# Patient Record
Sex: Female | Born: 1985 | Race: White | Hispanic: No | Marital: Single | Smoking: Never smoker
Health system: Southern US, Community
[De-identification: ages and names within clinical notes are randomized; demographics above are authoritative.]

## PROBLEM LIST (undated history)

## (undated) DIAGNOSIS — M7660 Achilles tendinitis, unspecified leg: Secondary | ICD-10-CM

## (undated) DIAGNOSIS — F32A Depression, unspecified: Secondary | ICD-10-CM

## (undated) DIAGNOSIS — I82409 Acute embolism and thrombosis of unspecified deep veins of unspecified lower extremity: Secondary | ICD-10-CM

## (undated) DIAGNOSIS — Z8619 Personal history of other infectious and parasitic diseases: Secondary | ICD-10-CM

## (undated) DIAGNOSIS — F419 Anxiety disorder, unspecified: Secondary | ICD-10-CM

## (undated) DIAGNOSIS — T7840XA Allergy, unspecified, initial encounter: Secondary | ICD-10-CM

## (undated) HISTORY — DX: Personal history of other infectious and parasitic diseases: Z86.19

## (undated) HISTORY — PX: WISDOM TOOTH EXTRACTION: SHX21

## (undated) HISTORY — DX: Allergy, unspecified, initial encounter: T78.40XA

## (undated) HISTORY — DX: Anxiety disorder, unspecified: F41.9

## (undated) HISTORY — DX: Depression, unspecified: F32.A

---

## 2011-01-03 ENCOUNTER — Ambulatory Visit (INDEPENDENT_AMBULATORY_CARE_PROVIDER_SITE_OTHER): Payer: BC Managed Care – PPO

## 2011-01-03 DIAGNOSIS — M25559 Pain in unspecified hip: Secondary | ICD-10-CM

## 2011-07-20 ENCOUNTER — Ambulatory Visit (INDEPENDENT_AMBULATORY_CARE_PROVIDER_SITE_OTHER): Payer: BC Managed Care – PPO | Admitting: Physician Assistant

## 2011-07-20 VITALS — BP 136/82 | HR 79 | Temp 97.9°F | Resp 18 | Ht 64.0 in | Wt 147.6 lb

## 2011-07-20 DIAGNOSIS — L0291 Cutaneous abscess, unspecified: Secondary | ICD-10-CM

## 2011-07-20 DIAGNOSIS — R21 Rash and other nonspecific skin eruption: Secondary | ICD-10-CM

## 2011-07-20 DIAGNOSIS — L0390 Cellulitis, unspecified: Secondary | ICD-10-CM

## 2011-07-20 LAB — POCT CBC
Granulocyte percent: 64.3 %G (ref 37–80)
HCT, POC: 41.8 % (ref 37.7–47.9)
Hemoglobin: 13.5 g/dL (ref 12.2–16.2)
Lymph, poc: 3.1 (ref 0.6–3.4)
MCH, POC: 29 pg (ref 27–31.2)
MCHC: 32.3 g/dL (ref 31.8–35.4)
MCV: 89.9 fL (ref 80–97)
MID (cbc): 0.6 (ref 0–0.9)
MPV: 9 fL (ref 0–99.8)
POC Granulocyte: 6.6 (ref 2–6.9)
POC LYMPH PERCENT: 30.2 %L (ref 10–50)
POC MID %: 5.5 %M (ref 0–12)
Platelet Count, POC: 281 10*3/uL (ref 142–424)
RBC: 4.65 M/uL (ref 4.04–5.48)
RDW, POC: 12.8 %
WBC: 10.3 10*3/uL — AB (ref 4.6–10.2)

## 2011-07-20 MED ORDER — DOXYCYCLINE HYCLATE 100 MG PO CAPS: 100.0000 mg | ORAL_CAPSULE | Freq: Two times a day (BID) | ORAL | Status: AC

## 2011-07-20 NOTE — Progress Notes (Signed)
  Subjective:    Patient ID: Laura Glass, female    DOB: 08-02-1985, 26 y.o.   MRN: [id number]  HPI Patient presents with a possible bug bite on her left arm. Says 2 days ago she noticed a small bump, and then yesterday there was an area of redness around the bite. Today, it is tender to touch, has more surrounding erythema, and is warm. Admits to some clear drainage yesterday. She did not see a bug bite her and did not pull a tick off of her arm. She does admit to being out in the yard 4 days ago. Denies fever, chills, nausea, vomiting, or abdominal pain.    Review of Systems  Constitutional: Negative for fever and chills.  Gastrointestinal: Negative for nausea, vomiting and abdominal pain.  Musculoskeletal: Negative for myalgias, joint swelling and arthralgias.  Skin: Positive for rash.       Objective:   Physical Exam  Constitutional: She is oriented to person, place, and time. She appears well-developed and well-nourished.  HENT:  Head: Normocephalic and atraumatic.  Right Ear: External ear normal.  Left Ear: External ear normal.  Eyes: Conjunctivae are normal.  Neck: Normal range of motion.  Cardiovascular: Normal rate and normal heart sounds.   Pulmonary/Chest: Effort normal and breath sounds normal.  Lymphadenopathy:    She has no cervical adenopathy.  Neurological: She is alert and oriented to person, place, and time.  Skin:     Psychiatric: Her behavior is normal. Judgment and thought content normal.          Assessment & Plan:   1. Rash and nonspecific skin eruption  Will treat with Doxycycline twice daily x 10 days. Await wound culture.  May take Zyrtec daily in the a.m and Benadryl at night POCT CBC  2. Cellulitis  Return in 48 hours if symptoms not improving, sooner if worse.  doxycycline (VIBRAMYCIN) 100 MG capsule, Wound culture

## 2011-07-23 LAB — WOUND CULTURE
Gram Stain: NONE SEEN
Gram Stain: NONE SEEN

## 2011-07-27 ENCOUNTER — Telehealth: Payer: Self-pay

## 2011-07-27 NOTE — Telephone Encounter (Signed)
Left message for patient to call back, I want to make sure she is taking it with food and at least one hour before bedtime, will ask PA for further after getting more details.

## 2011-07-27 NOTE — Telephone Encounter (Signed)
Pt prescribe doxyicicline and since she has been taking it, medication is giving her acid reflux feeling please contact pt [phone]

## 2011-07-28 NOTE — Telephone Encounter (Signed)
Pt reports that she took the Doxy for about 3 days and then stopped it bc of the acid reflux it was causing. She stated she was taking it w/food but had the reflux pretty much constantly while on it. She tried OTC acid med and it did not help. Pt reports that the Doxy did seem to help. Her wound is healing well but not completely resolved.

## 2011-07-28 NOTE — Telephone Encounter (Signed)
As long as her wound is improving and she has no other symptoms (fever, chills, nausea, vomiting), she can stay off of the medication. Follow up if worsening.

## 2011-07-28 NOTE — Telephone Encounter (Signed)
Gave pt instr's from [place]. Pt verbalized understanding and agreement.

## 2012-06-12 ENCOUNTER — Other Ambulatory Visit: Payer: Self-pay | Admitting: Obstetrics and Gynecology

## 2012-06-15 ENCOUNTER — Other Ambulatory Visit: Payer: Self-pay | Admitting: Obstetrics and Gynecology

## 2013-08-21 ENCOUNTER — Ambulatory Visit (INDEPENDENT_AMBULATORY_CARE_PROVIDER_SITE_OTHER): Payer: BC Managed Care – PPO | Admitting: Emergency Medicine

## 2013-08-21 VITALS — BP 118/60 | HR 77 | Temp 99.0°F | Resp 16 | Ht 64.0 in | Wt 158.8 lb

## 2013-08-21 DIAGNOSIS — S79919A Unspecified injury of unspecified hip, initial encounter: Secondary | ICD-10-CM

## 2013-08-21 DIAGNOSIS — S79929A Unspecified injury of unspecified thigh, initial encounter: Secondary | ICD-10-CM

## 2013-08-21 DIAGNOSIS — S76301A Unspecified injury of muscle, fascia and tendon of the posterior muscle group at thigh level, right thigh, initial encounter: Secondary | ICD-10-CM

## 2013-08-21 MED ORDER — NAPROXEN SODIUM 550 MG PO TABS
550.0000 mg | ORAL_TABLET | Freq: Two times a day (BID) | ORAL | Status: DC
Start: 2013-08-21 — End: 2013-11-25

## 2013-08-21 NOTE — Progress Notes (Signed)
[facility] [address], [place] [address] [phone]  Date:  08/21/2013   Name:  Laura RombergBrittany Glass   DOB:  [birth year]   MRN:  [id number]  PCP:  No PCP Per Patient    Chief Complaint: pulled hamstring   History of Present Illness:  Laura RombergBrittany Glass is a 28 y.o. very pleasant female patient who presents with the following:  Injured her right thigh running to first base 10 days ago.  Has pain in the posterior thigh proximally.  Pain in knee as well.  (right).  Some ecchymosis posterior thigh just above the knee.  No improvement with over the counter medications or other home remedies. Denies other complaint or health concern today.   There are no active problems to display for this patient.   History reviewed. No pertinent past medical history.  History reviewed. No pertinent past surgical history.  History  Substance Use Topics  . Smoking status: Never Smoker   . Smokeless tobacco: Not on file  . Alcohol Use: Not on file    History reviewed. No pertinent family history.  No Known Allergies  Medication list has been reviewed and updated.  Current Outpatient Prescriptions on File Prior to Visit  Medication Sig Dispense Refill  . norethindrone-ethinyl estradiol (JUNEL FE,GILDESS FE,LOESTRIN FE) 1-20 MG-MCG tablet Take 1 tablet by mouth daily.       No current facility-administered medications on file prior to visit.    Review of Systems:  As per HPI, otherwise negative.    Physical Examination: Filed Vitals:   08/21/13 1536  BP: 118/60  Pulse: 77  Temp: 99 F (37.2 C)  Resp: 16   Filed Vitals:   08/21/13 1536  Height: 5\' 4"  (1.626 m)  Weight: 158 lb 12.8 oz (72.031 kg)   Body mass index is 27.24 kg/(m^2). Ideal Body Weight: Weight in (lb) to have BMI = 25: 145.3   GEN: WDWN, NAD, Non-toxic, Alert & Oriented x 3 HEENT: Atraumatic, Normocephalic.  Ears and Nose: No external deformity. EXTR: No clubbing/cyanosis/edema NEURO:  Normal gait.  PSYCH: Normally interactive. Conversant. Not depressed or anxious appearing.  Calm demeanor.  RIGHT leg:  Knee no effusion.  Joint stable.  Full ROM.   Thigh:  Ecchymosis posterior just proximal to popliteal space  Assessment and Plan: Hamstring tear Anaprox   Signed,  Phillips OdorJeffery Kalliopi Coupland, MD

## 2013-08-21 NOTE — Patient Instructions (Signed)

## 2013-11-25 ENCOUNTER — Ambulatory Visit (INDEPENDENT_AMBULATORY_CARE_PROVIDER_SITE_OTHER): Payer: BC Managed Care – PPO | Admitting: Emergency Medicine

## 2013-11-25 VITALS — BP 110/68 | HR 75 | Temp 98.4°F | Resp 18 | Ht 64.0 in | Wt 151.0 lb

## 2013-11-25 DIAGNOSIS — Z0189 Encounter for other specified special examinations: Secondary | ICD-10-CM

## 2013-11-25 DIAGNOSIS — Z0000 Encounter for general adult medical examination without abnormal findings: Secondary | ICD-10-CM

## 2013-11-25 NOTE — Progress Notes (Signed)

## 2013-11-25 NOTE — Progress Notes (Signed)
[facility] [address], [place] [address] [phone]  Date:  11/25/2013   Name:  Leonidas RombergBrittany Frankowski   DOB:  07/31/1985   MRN:  [id number]  PCP:  No PCP Per Patient    Chief Complaint: Employment Physical   History of Present Illness:  Leonidas RombergBrittany Meuth is a 28 y.o. very pleasant female patient who presents with the following:  Wellness examination Up to date on PAP   There are no active problems to display for this patient.   Past Medical History  Diagnosis Date  . Allergy     History reviewed. No pertinent past surgical history.  History  Substance Use Topics  . Smoking status: Never Smoker   . Smokeless tobacco: Not on file  . Alcohol Use: Not on file    Family History  Problem Relation Age of Onset  . Diabetes Mother     No Known Allergies  Medication list has been reviewed and updated.  Current Outpatient Prescriptions on File Prior to Visit  Medication Sig Dispense Refill  . norethindrone-ethinyl estradiol (JUNEL FE,GILDESS FE,LOESTRIN FE) 1-20 MG-MCG tablet Take 1 tablet by mouth daily.     No current facility-administered medications on file prior to visit.    Review of Systems:  As per HPI, otherwise negative.    Physical Examination: Filed Vitals:   11/25/13 0850  BP: 110/68  Pulse: 75  Temp: 98.4 F (36.9 C)  Resp: 18   Filed Vitals:   11/25/13 0850  Height: 5\' 4"  (1.626 m)  Weight: 151 lb (68.493 kg)   Body mass index is 25.91 kg/(m^2). Ideal Body Weight: Weight in (lb) to have BMI = 25: 145.3  GEN: WDWN, NAD, Non-toxic, A & O x 3 HEENT: Atraumatic, Normocephalic. Neck supple. No masses, No LAD. Ears and Nose: No external deformity. CV: RRR, No M/G/R. No JVD. No thrill. No extra heart sounds. PULM: CTA B, no wheezes, crackles, rhonchi. No retractions. No resp. distress. No accessory muscle use. ABD: S, NT, ND, +BS. No rebound. No HSM. EXTR: No c/c/e NEURO Normal gait.  PSYCH: Normally interactive.  Conversant. Not depressed or anxious appearing.  Calm demeanor.    Assessment and Plan: Wellness examination Declines blood work TBST   Signed,  Phillips OdorJeffery Anderson, MD

## 2014-01-02 ENCOUNTER — Encounter (HOSPITAL_COMMUNITY): Payer: Self-pay | Admitting: Emergency Medicine

## 2014-01-02 ENCOUNTER — Emergency Department (HOSPITAL_COMMUNITY)
Admission: EM | Admit: 2014-01-02 | Discharge: 2014-01-02 | Disposition: A | Discharge status: Home / Self Care | Payer: BC Managed Care – PPO | Source: Home / Self Care | Attending: Emergency Medicine | Admitting: Emergency Medicine

## 2014-01-02 DIAGNOSIS — J018 Other acute sinusitis: Secondary | ICD-10-CM

## 2014-01-02 MED ORDER — IPRATROPIUM BROMIDE 0.06 % NA SOLN: 2.0000 | Freq: Four times a day (QID) | NASAL | Status: DC

## 2014-01-02 MED ORDER — AMOXICILLIN-POT CLAVULANATE 875-125 MG PO TABS: 1.0000 | ORAL_TABLET | Freq: Two times a day (BID) | ORAL | Status: DC

## 2014-01-02 NOTE — ED Provider Notes (Signed)
CSN: [id number]     Arrival date & time 01/02/14  1550 History   First MD Initiated Contact with Patient 01/02/14 1619     Chief Complaint  Patient presents with  . Sore Throat   (Consider location/radiation/quality/duration/timing/severity/associated sxs/prior Treatment) HPI She is a 28 year old woman here for evaluation of nasal congestion. She states she has had upper respiratory symptoms for about a week. She was starting to get better on Monday, but then had worsening of her nasal congestion. She denies any fevers. She does have a mild sore throat and a mild cough. She does report some mild ear pain as well. No nausea or vomiting.  Past Medical History  Diagnosis Date  . Allergy    History reviewed. No pertinent past surgical history. Family History  Problem Relation Age of Onset  . Diabetes Mother    History  Substance Use Topics  . Smoking status: Never Smoker   . Smokeless tobacco: Not on file  . Alcohol Use: No   OB History    No data available     Review of Systems  Constitutional: Negative for fever.  HENT: Positive for congestion, ear pain, rhinorrhea, sinus pressure and sore throat. Negative for trouble swallowing.   Respiratory: Positive for cough. Negative for shortness of breath.   Gastrointestinal: Negative for nausea.  Neurological: Positive for headaches.    Allergies  Review of patient's allergies indicates no known allergies.  Home Medications   Prior to Admission medications   Medication Sig Start Date End Date Taking? Authorizing Provider  amoxicillin-clavulanate (AUGMENTIN) 875-125 MG per tablet Take 1 tablet by mouth 2 (two) times daily. 01/02/14   Charm RingsErin J Honig, MD  ipratropium (ATROVENT) 0.06 % nasal spray Place 2 sprays into both nostrils 4 (four) times daily. 01/02/14   Charm RingsErin J Honig, MD  norethindrone-ethinyl estradiol (JUNEL FE,GILDESS FE,LOESTRIN FE) 1-20 MG-MCG tablet Take 1 tablet by mouth daily.    Historical Provider, MD   BP 133/95  mmHg  Pulse 80  Temp(Src) 98.6 F (37 C) (Oral)  Resp 14  SpO2 100%  LMP 12/12/2013 (Approximate) Physical Exam  Constitutional: She is oriented to person, place, and time. She appears well-developed and well-nourished.  HENT:  Head: Normocephalic and atraumatic.  Right Ear: Tympanic membrane and external ear normal.  Left Ear: Tympanic membrane and external ear normal.  Nose: Mucosal edema and rhinorrhea present. Right sinus exhibits maxillary sinus tenderness. Right sinus exhibits no frontal sinus tenderness. Left sinus exhibits maxillary sinus tenderness. Left sinus exhibits no frontal sinus tenderness.  Mouth/Throat: Mucous membranes are normal. No oropharyngeal exudate or posterior oropharyngeal erythema.  Neck: Neck supple.  Cardiovascular: Normal rate, regular rhythm and normal heart sounds.   No murmur heard. Pulmonary/Chest: Effort normal and breath sounds normal. No respiratory distress. She has no wheezes. She has no rales.  Lymphadenopathy:    She has no cervical adenopathy.  Neurological: She is alert and oriented to person, place, and time.    ED Course  Procedures (including critical care time) Labs Review Labs Reviewed - No data to display  Imaging Review No results found.   MDM   1. Other acute sinusitis    Time course concerning for bacterial sinusitis. We'll treat with Augmentin for 10 days. Also Atrovent nasal spray to help with congestion. Follow-up as needed.    Charm RingsErin J Honig, MD 01/02/14 [phone]

## 2014-01-02 NOTE — Discharge Instructions (Signed)
You have a sinus infection. Take Augmentin 1 pill twice a day for 10 days. Use Atrovent nasal spray to help with the congestion.  Follow up if your symptoms do not improve in the next week or you develop fevers/severe facial pain.

## 2014-01-02 NOTE — ED Notes (Signed)
28 year old female here today with complaints of a sore throat, headache, and general URI symptoms for about 1 week.

## 2014-08-12 ENCOUNTER — Ambulatory Visit (INDEPENDENT_AMBULATORY_CARE_PROVIDER_SITE_OTHER): Payer: BLUE CROSS/BLUE SHIELD

## 2014-08-12 ENCOUNTER — Ambulatory Visit (INDEPENDENT_AMBULATORY_CARE_PROVIDER_SITE_OTHER): Payer: BLUE CROSS/BLUE SHIELD | Admitting: Internal Medicine

## 2014-08-12 VITALS — BP 126/70 | HR 66 | Temp 98.6°F | Resp 16 | Ht 65.0 in | Wt 147.6 lb

## 2014-08-12 DIAGNOSIS — M25561 Pain in right knee: Secondary | ICD-10-CM | POA: Diagnosis not present

## 2014-08-12 DIAGNOSIS — M79672 Pain in left foot: Secondary | ICD-10-CM

## 2014-08-12 MED ORDER — MELOXICAM 15 MG PO TABS: 15.0000 mg | ORAL_TABLET | Freq: Every day | ORAL | Status: DC

## 2014-08-12 NOTE — Progress Notes (Signed)
Subjective:  This chart was scribed for Ellamae Sia, MD by Stann Ore, Medical Scribe. This patient was seen in Room 12 and the patient's care was started at 12:18 PM.     Patient ID: Laura Glass, female    DOB: 1985/04/11, 29 y.o.   MRN: [id number]  HPI Laura Glass is a 29 y.o. female who presents to [facility] complaining of left heel pain that has started 2 years ago. She notes that the pain has stopped for a while, but recently, it started again and hurts all the time. She denies having x-ray done on the area. No specific injury. This is not under the heel but at the very backside. Hurts in the morning. Hurts with weightbearing. Hurts with flexion of the foot.  She also mentions pain behind her right knee. She feels the pain when she pulls her knee in. She is able to walk and run; however, she feels the pain. This pain will ache at night. She is currently wearing a knee compression sleeve and also takes Advil before she plays softball. She denies any major injuries. She also says she was here 2 years ago for similar problem and was given anaprox for temporary relief. The diagnosis was hamstring tear given visible ecchymoses in the popliteal area per Dr. Dareen Piano. However, She is able to run and play with this discomfort while wearing the compression sleeve and the pain does not seem to extend into the calf or up into the posterior thigh after use. She does occasionally have some swelling the next day which seems to be in the knee. She also has a give way sensation at times  Pt mentions she plays softball frequently and stays active at the gym.   There are no active problems to display for this patient.   Current outpatient prescriptions:  .  norethindrone-ethinyl estradiol (JUNEL FE,GILDESS FE,LOESTRIN FE) 1-20 MG-MCG tablet, Take 1 tablet by mouth daily., Disp: , Rfl:  .  amoxicillin-clavulanate (AUGMENTIN) 875-125 MG per tablet, Take 1 tablet by mouth 2 (two) times daily. (Patient  not taking: Reported on 08/12/2014), Disp: 20 tablet, Rfl: 0 .  ipratropium (ATROVENT) 0.06 % nasal spray, Place 2 sprays into both nostrils 4 (four) times daily. (Patient not taking: Reported on 08/12/2014), Disp: 15 mL, Rfl: 0    Review of Systems  Constitutional: Negative for fever, chills, diaphoresis and fatigue.  Gastrointestinal: Negative for nausea, vomiting, diarrhea and constipation.  Musculoskeletal: Positive for arthralgias (left heel, behind right knee).  Skin: Negative for rash and wound.  Neurological: Negative for dizziness, weakness and headaches.       Objective:   Physical Exam  Constitutional: She is oriented to person, place, and time. She appears well-developed and well-nourished. No distress.  HENT:  Head: Normocephalic and atraumatic.  Eyes: EOM are normal. Pupils are equal, round, and reactive to light.  Neck: Neck supple.  Cardiovascular: Normal rate.   Pulmonary/Chest: Effort normal. No respiratory distress.  Musculoskeletal: Normal range of motion.  The right knee is not swollen, and is not tender to palpation or range of motion, and shows no laxity to stress ors including a negative McMurray  The left heel is tender to palpation at the insertion of the Achilles more medially than laterally without effusion redness or  actual swelling of the Achilles  Neurological: She is alert and oriented to person, place, and time.  Skin: Skin is warm and dry.  Psychiatric: She has a normal mood and affect. Her behavior is normal.  Nursing note and vitals reviewed.   BP 126/70 mmHg  Pulse 66  Temp(Src) 98.6 F (37 C) (Oral)  Resp 16  Ht 5\' 5"  (1.651 m)  Wt 147 lb 9.6 oz (66.951 kg)  BMI 24.56 kg/m2  SpO2 98%  LMP 07/29/2014  [facility] reading (PRIMARY) by  Dr.Malene Blaydes= no fracture of the calcaneus       Assessment & Plan:  Pain, heel, left - Plan: DG [place] Left, Ambulatory referral to Sports Medicine --I'll put her on meloxicam with a heel left and have  her swim more than run but I think she would benefit from a sports medicine consult so an ultrasound can be performed and consideration for nitroglycerin made as this seems like a chronic inflammatory process at the insertion of Achilles  Knee pain, right - Plan: Ambulatory referral to Sports Medicine This history is suspicious for a meniscal injury and so I have recommended a second opinion  Meds ordered this encounter  Medications  . meloxicam (MOBIC) 15 MG tablet    Sig: Take 1 tablet (15 mg total) by mouth daily.    Dispense:  30 tablet    Refill:  0    I have completed the patient encounter in its entirety as documented by the scribe, with editing by me where necessary. Suhas Estis P. Merla Richesoolittle, M.D.

## 2014-08-26 ENCOUNTER — Ambulatory Visit (INDEPENDENT_AMBULATORY_CARE_PROVIDER_SITE_OTHER): Payer: BLUE CROSS/BLUE SHIELD | Admitting: Sports Medicine

## 2014-08-26 ENCOUNTER — Encounter: Payer: Self-pay | Admitting: Sports Medicine

## 2014-08-26 VITALS — BP 128/74 | Ht 65.0 in | Wt 145.0 lb

## 2014-08-26 DIAGNOSIS — M67879 Other specified disorders of synovium and tendon, unspecified ankle and foot: Secondary | ICD-10-CM | POA: Diagnosis not present

## 2014-08-26 DIAGNOSIS — M7660 Achilles tendinitis, unspecified leg: Secondary | ICD-10-CM

## 2014-08-26 MED ORDER — NITROGLYCERIN 0.2 MG/HR TD PT24: 0.2000 mg | MEDICATED_PATCH | Freq: Every day | TRANSDERMAL | Status: DC

## 2014-08-26 NOTE — Progress Notes (Signed)
   Subjective:    Patient ID: Laura Glass, female    DOB: October 03, 1985, 29 y.o.   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 She is complaining of pain in the posterior aspect of her right knee. She has had symptoms intermittently for 2-3 years. She describes an aching discomfort which was initially present only with playing softball but has become more noticeable now with activities of daily living. Her pain is most noticeable with going up and down stairs or with prolonged walking. She denies any instability. No known trauma. She's not noticed any swelling. No prior knee surgeries. Pain does not radiate into her calf or foot. No associated numbness or tingling. She has had similar issues in the past with her left knee as well. She is also complaining of 3 months of posterior left heel pain. Again, no known trauma. Pain is worse at the end of running. She localizes her pain to the insertion of the Achilles tendon onto the posterior calcaneus. She has not noticed any swelling. Recent x-rays were unremarkable. She denies any pain along the plantar aspect of her foot. She was prescribed meloxicam at a local urgent care which did seem to help some.    Review of Systems As above    Objective:   Physical Exam Well-developed, fit appearing. No acute distress. Awake alert and oriented 3.  Right knee: Full range of motion. No effusion. No soft tissue swelling. Negative anterior drawer, negative posterior drawer. No joint line tenderness. Negative McMurray's. Negative Thessaly's. She is tender to palpation in the popliteal fossa near the area of the popliteus muscle and the medial head of the gastrocnemius. No tenderness to palpation over the hamstrings. Neurovascularly intact distally.  Left heel: There is tenderness to palpation at the insertion of the Achilles tendon onto the calcaneus. No soft  tissue swelling. Negative calcaneal squeeze. No tenderness to palpation along the mid substance of the Achilles. Good strength. Neurovascularly intact distally. Walking without significant limp.  MSK ultrasound of the left Achilles tendon was performed. Limited images were obtained. Both long and short axis images were saved. There are hypoechoic changes as well as several areas of calcification in the distal Achilles tendon just proximal to the insertion of the calcaneus. These changes are seen both in long and short view. Remainder of the Achilles tendon is unremarkable. 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 I have referred her to Ellamae Sia for treatment of both her knee and her heel. In addition I think we should try a 5/16 inch heel lift and I will start her on a topical nitroglycerin protocol for her Achilles tendinopathy. She will use a quarter patch daily. Follow-up with me in 6 weeks. We will plan on repeating her ultrasound at that time. She may continue with activity as tolerated using pain as her guide. I've also encouraged her to utilize icing post exercise. Call with questions or concerns prior to her follow-up visit.

## 2014-08-26 NOTE — Patient Instructions (Signed)

## 2014-09-26 ENCOUNTER — Ambulatory Visit (INDEPENDENT_AMBULATORY_CARE_PROVIDER_SITE_OTHER): Payer: BLUE CROSS/BLUE SHIELD | Admitting: Family Medicine

## 2014-09-26 VITALS — BP 120/78 | HR 70 | Temp 97.3°F | Resp 16 | Ht 65.0 in | Wt 147.0 lb

## 2014-09-26 DIAGNOSIS — J069 Acute upper respiratory infection, unspecified: Secondary | ICD-10-CM | POA: Diagnosis not present

## 2014-09-26 NOTE — Progress Notes (Addendum)
Subjective:  This chart was scribed for Meredith Staggers, MD by Andrew Au, ED Scribe. This patient was seen in room 3 and the patient's care was started at 4:54 PM.  Patient ID: Laura Glass, female    DOB: 03/06/1985, 29 y.o.   MRN: [id number]  HPI   Chief Complaint  Patient presents with  . Sinusitis    Onset 2 days  . Headache  . Sore Throat   HPI Comments: Laura Glass is a 29 y.o. female who presents to the [facility] complaining of nasal congestion and HA that began 3 days ago. Pt states symptoms started with nasal congestion with clear drainage, sore throat, HA and upper bilateral teeth and face pain, more yesterday than today. She also had hot and cold spells but did not measure a fever. Pt has tried tylenol cold and sinus without relief to symptoms. 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 She denies children having similar symptom.     There are no active problems to display for this patient.  Past Medical History  Diagnosis Date  . Allergy    No past surgical history on file. No Known Allergies Prior to Admission medications   Medication Sig Start Date End Date Taking? Authorizing Provider  JUNEL FE 1.5/30 1.5-30 MG-MCG tablet TAKE 1 TABLET(S) EVERY DAY BY ORAL ROUTE. 07/04/14  Yes Historical Provider, MD  meloxicam (MOBIC) 15 MG tablet Take 1 tablet (15 mg total) by mouth daily. 08/12/14  Yes Tonye Pearson, MD  nitroGLYCERIN (NITRO-DUR) 0.2 mg/hr patch Place 1 patch (0.2 mg total) onto the skin daily. 08/26/14  Yes Ralene Cork, DO  norethindrone-ethinyl estradiol (JUNEL FE,GILDESS FE,LOESTRIN FE) 1-20 MG-MCG tablet Take 1 tablet by mouth daily.   Yes Historical Provider, MD  ipratropium (ATROVENT) 0.06 % nasal spray Place 2 sprays into both nostrils 4 (four) times daily. Patient not taking: Reported on 08/12/2014 01/02/14   Charm Rings, MD   Social History   Social History  . Marital Status: Single    Spouse Name: N/A    . Number of Children: N/A  . Years of Education: N/A   Occupational History  . Not on file.   Social History Main Topics  . Smoking status: Never Smoker   . Smokeless tobacco: Not on file  . Alcohol Use: No  . Drug Use: No  . Sexual Activity: Yes    Birth Control/ Protection: Pill   Other Topics Concern  . Not on file   Social History Narrative   Review of Systems  Constitutional: Positive for chills. Negative for fever.  HENT: Positive for congestion, dental problem ( pain), sinus pressure and sore throat.   Neurological: Positive for headaches.   Objective:   Physical Exam  Constitutional: She is oriented to person, place, and time. She appears well-developed and well-nourished. No distress.  HENT:  Head: Normocephalic and atraumatic.  Right Ear: External ear normal.  Left Ear: External ear normal.  Mouth/Throat: Uvula is midline, oropharynx is clear and moist and mucous membranes are normal. No oropharyngeal exudate.  Edematous turbinates bilaterally. No discharge.   Eyes: Conjunctivae and EOM are normal. Pupils are equal, round, and reactive to light.  Neck: Normal range of motion. Neck supple.  Cardiovascular: Normal rate, regular rhythm and normal heart sounds.  Exam reveals no gallop and no friction rub.   No murmur heard. Pulmonary/Chest: Effort normal and breath sounds normal. No respiratory distress. She has no wheezes. She has no  rales.  Musculoskeletal: Normal range of motion.  Neurological: She is alert and oriented to person, place, and time.  Skin: Skin is warm and dry.  Psychiatric: She has a normal mood and affect. Her behavior is normal.  Nursing note and vitals reviewed.  Filed Vitals:   09/26/14 1628  BP: 120/78  Pulse: 70  Temp: 97.3 F (36.3 C)  TempSrc: Oral  Resp: 16  Height: 5\' 5"  (1.651 m)  Weight: 147 lb (66.679 kg)  SpO2: 98%   Assessment & Plan:  Laura Glass is a 29 y.o. female Acute upper respiratory infection  -Suspected  viral illness, with some sinus pressure. Doubt bacterial sinusitis at this time. Symptomatically care discussed, but if increasing sinus exhibits diffuse next week including secondary sickening or persistent purulent nasal discharge, can call in Augmentin. RTC precautions discussed.   No orders of the defined types were placed in this encounter.   Patient Instructions  Saline nasal spray atleast 4 times per day, over the counter mucinex or mucinex DM if needed for cough, decongestant such as Tylenol Sinus or Sudafed if needed for pressure in the head or face. Drink plenty of fluids.   If your symptoms start to worsen again with the increasing face pain, or discolored nasal discharge that is persisting into next week, give me a call and I can call in Augmentin for you. If you do start to have fevers or worsening, I recommend recheck here or other medical provider.  Return to the clinic or go to the nearest emergency room if any of your symptoms worsen or new symptoms occur.  Upper Respiratory Infection, Adult An upper respiratory infection (URI) is also sometimes known as the common cold. The upper respiratory tract includes the nose, sinuses, throat, trachea, and bronchi. Bronchi are the airways leading to the lungs. Most people improve within 1 week, but symptoms can last up to 2 weeks. A residual cough may last even longer.  CAUSES Many different viruses can infect the tissues lining the upper respiratory tract. The tissues become irritated and inflamed and often become very moist. Mucus production is also common. A cold is contagious. You can easily spread the virus to others by oral contact. This includes kissing, sharing a glass, coughing, or sneezing. Touching your mouth or nose and then touching a surface, which is then touched by another person, can also spread the virus. SYMPTOMS  Symptoms typically develop 1 to 3 days after you come in contact with a cold virus. Symptoms vary from person to  person. They may include:  Runny nose.  Sneezing.  Nasal congestion.  Sinus irritation.  Sore throat.  Loss of voice (laryngitis).  Cough.  Fatigue.  Muscle aches.  Loss of appetite.  Headache.  Low-grade fever. DIAGNOSIS  You might diagnose your own cold based on familiar symptoms, since most people get a cold 2 to 3 times a year. Your caregiver can confirm this based on your exam. Most importantly, your caregiver can check that your symptoms are not due to another disease such as strep throat, sinusitis, pneumonia, asthma, or epiglottitis. Blood tests, throat tests, and X-rays are not necessary to diagnose a common cold, but they may sometimes be helpful in excluding other more serious diseases. Your caregiver will decide if any further tests are required. RISKS AND COMPLICATIONS  You may be at risk for a more severe case of the common cold if you smoke cigarettes, have chronic heart disease (such as heart failure) or lung disease (such  as asthma), or if you have a weakened immune system. The very young and very old are also at risk for more serious infections. Bacterial sinusitis, middle ear infections, and bacterial pneumonia can complicate the common cold. The common cold can worsen asthma and chronic obstructive pulmonary disease (COPD). Sometimes, these complications can require emergency medical care and may be life-threatening. PREVENTION  The best way to protect against getting a cold is to practice good hygiene. Avoid oral or hand contact with people with cold symptoms. Wash your hands often if contact occurs. There is no clear evidence that vitamin C, vitamin E, echinacea, or exercise reduces the chance of developing a cold. However, it is always recommended to get plenty of rest and practice good nutrition. TREATMENT  Treatment is directed at relieving symptoms. There is no cure. Antibiotics are not effective, because the infection is caused by a virus, not by bacteria.  Treatment may include:  Increased fluid intake. Sports drinks offer valuable electrolytes, sugars, and fluids.  Breathing heated mist or steam (vaporizer or shower).  Eating chicken soup or other clear broths, and maintaining good nutrition.  Getting plenty of rest.  Using gargles or lozenges for comfort.  Controlling fevers with ibuprofen or acetaminophen as directed by your caregiver.  Increasing usage of your inhaler if you have asthma. Zinc gel and zinc lozenges, taken in the first 24 hours of the common cold, can shorten the duration and lessen the severity of symptoms. Pain medicines may help with fever, muscle aches, and throat pain. A variety of non-prescription medicines are available to treat congestion and runny nose. Your caregiver can make recommendations and may suggest nasal or lung inhalers for other symptoms.  HOME CARE INSTRUCTIONS   Only take over-the-counter or prescription medicines for pain, discomfort, or fever as directed by your caregiver.  Use a warm mist humidifier or inhale steam from a shower to increase air moisture. This may keep secretions moist and make it easier to breathe.  Drink enough water and fluids to keep your urine clear or pale yellow.  Rest as needed.  Return to work when your temperature has returned to normal or as your caregiver advises. You may need to stay home longer to avoid infecting others. You can also use a face mask and careful hand washing to prevent spread of the virus. SEEK MEDICAL CARE IF:   After the first few days, you feel you are getting worse rather than better.  You need your caregiver's advice about medicines to control symptoms.  You develop chills, worsening shortness of breath, or brown or red sputum. These may be signs of pneumonia.  You develop yellow or brown nasal discharge or pain in the face, especially when you bend forward. These may be signs of sinusitis.  You develop a fever, swollen neck glands, pain  with swallowing, or white areas in the back of your throat. These may be signs of strep throat. SEEK IMMEDIATE MEDICAL CARE IF:   You have a fever.  You develop severe or persistent headache, ear pain, sinus pain, or chest pain.  You develop wheezing, a prolonged cough, cough up blood, or have a change in your usual mucus (if you have chronic lung disease).  You develop sore muscles or a stiff neck. Document Released: 07/06/2000 Document Revised: 04/04/2011 Document Reviewed: 04/17/2013 [facility] Patient Information 2015 [place], [place]. This information is not intended to replace advice given to you by your health care provider. Make sure you discuss any questions you have  with your health care provider.    I personally performed the services described in this documentation, which was scribed in my presence. The recorded information has been reviewed and considered, and addended by me as needed.

## 2014-09-26 NOTE — Patient Instructions (Signed)
Saline nasal spray atleast 4 times per day, over the counter mucinex or mucinex DM if needed for cough, decongestant such as Tylenol Sinus or Sudafed if needed for pressure in the head or face. Drink plenty of fluids.   If your symptoms start to worsen again with the increasing face pain, or discolored nasal discharge that is persisting into next week, give me a call and I can call in Augmentin for you. If you do start to have fevers or worsening, I recommend recheck here or other medical provider.  Return to the clinic or go to the nearest emergency room if any of your symptoms worsen or new symptoms occur.  Upper Respiratory Infection, Adult An upper respiratory infection (URI) is also sometimes known as the common cold. The upper respiratory tract includes the nose, sinuses, throat, trachea, and bronchi. Bronchi are the airways leading to the lungs. Most people improve within 1 week, but symptoms can last up to 2 weeks. A residual cough may last even longer.  CAUSES Many different viruses can infect the tissues lining the upper respiratory tract. The tissues become irritated and inflamed and often become very moist. Mucus production is also common. A cold is contagious. You can easily spread the virus to others by oral contact. This includes kissing, sharing a glass, coughing, or sneezing. Touching your mouth or nose and then touching a surface, which is then touched by another person, can also spread the virus. SYMPTOMS  Symptoms typically develop 1 to 3 days after you come in contact with a cold virus. Symptoms vary from person to person. They may include:  Runny nose.  Sneezing.  Nasal congestion.  Sinus irritation.  Sore throat.  Loss of voice (laryngitis).  Cough.  Fatigue.  Muscle aches.  Loss of appetite.  Headache.  Low-grade fever. DIAGNOSIS  You might diagnose your own cold based on familiar symptoms, since most people get a cold 2 to 3 times a year. Your caregiver  can confirm this based on your exam. Most importantly, your caregiver can check that your symptoms are not due to another disease such as strep throat, sinusitis, pneumonia, asthma, or epiglottitis. Blood tests, throat tests, and X-rays are not necessary to diagnose a common cold, but they may sometimes be helpful in excluding other more serious diseases. Your caregiver will decide if any further tests are required. RISKS AND COMPLICATIONS  You may be at risk for a more severe case of the common cold if you smoke cigarettes, have chronic heart disease (such as heart failure) or lung disease (such as asthma), or if you have a weakened immune system. The very young and very old are also at risk for more serious infections. Bacterial sinusitis, middle ear infections, and bacterial pneumonia can complicate the common cold. The common cold can worsen asthma and chronic obstructive pulmonary disease (COPD). Sometimes, these complications can require emergency medical care and may be life-threatening. PREVENTION  The best way to protect against getting a cold is to practice good hygiene. Avoid oral or hand contact with people with cold symptoms. Wash your hands often if contact occurs. There is no clear evidence that vitamin C, vitamin E, echinacea, or exercise reduces the chance of developing a cold. However, it is always recommended to get plenty of rest and practice good nutrition. TREATMENT  Treatment is directed at relieving symptoms. There is no cure. Antibiotics are not effective, because the infection is caused by a virus, not by bacteria. Treatment may include:  Increased fluid  intake. Sports drinks offer valuable electrolytes, sugars, and fluids.  Breathing heated mist or steam (vaporizer or shower).  Eating chicken soup or other clear broths, and maintaining good nutrition.  Getting plenty of rest.  Using gargles or lozenges for comfort.  Controlling fevers with ibuprofen or acetaminophen as  directed by your caregiver.  Increasing usage of your inhaler if you have asthma. Zinc gel and zinc lozenges, taken in the first 24 hours of the common cold, can shorten the duration and lessen the severity of symptoms. Pain medicines may help with fever, muscle aches, and throat pain. A variety of non-prescription medicines are available to treat congestion and runny nose. Your caregiver can make recommendations and may suggest nasal or lung inhalers for other symptoms.  HOME CARE INSTRUCTIONS   Only take over-the-counter or prescription medicines for pain, discomfort, or fever as directed by your caregiver.  Use a warm mist humidifier or inhale steam from a shower to increase air moisture. This may keep secretions moist and make it easier to breathe.  Drink enough water and fluids to keep your urine clear or pale yellow.  Rest as needed.  Return to work when your temperature has returned to normal or as your caregiver advises. You may need to stay home longer to avoid infecting others. You can also use a face mask and careful hand washing to prevent spread of the virus. SEEK MEDICAL CARE IF:   After the first few days, you feel you are getting worse rather than better.  You need your caregiver's advice about medicines to control symptoms.  You develop chills, worsening shortness of breath, or brown or red sputum. These may be signs of pneumonia.  You develop yellow or brown nasal discharge or pain in the face, especially when you bend forward. These may be signs of sinusitis.  You develop a fever, swollen neck glands, pain with swallowing, or white areas in the back of your throat. These may be signs of strep throat. SEEK IMMEDIATE MEDICAL CARE IF:   You have a fever.  You develop severe or persistent headache, ear pain, sinus pain, or chest pain.  You develop wheezing, a prolonged cough, cough up blood, or have a change in your usual mucus (if you have chronic lung disease).  You  develop sore muscles or a stiff neck. Document Released: 07/06/2000 Document Revised: 04/04/2011 Document Reviewed: 04/17/2013 [facility] Patient Information 2015 [place], [place]. This information is not intended to replace advice given to you by your health care provider. Make sure you discuss any questions you have with your health care provider.

## 2014-10-02 LAB — HM PAP SMEAR: HM Pap smear: NORMAL

## 2014-10-06 ENCOUNTER — Ambulatory Visit (INDEPENDENT_AMBULATORY_CARE_PROVIDER_SITE_OTHER): Payer: BLUE CROSS/BLUE SHIELD | Admitting: Sports Medicine

## 2014-10-06 ENCOUNTER — Encounter: Payer: Self-pay | Admitting: Sports Medicine

## 2014-10-06 VITALS — BP 115/71 | Ht 65.0 in | Wt 144.0 lb

## 2014-10-06 DIAGNOSIS — M67879 Other specified disorders of synovium and tendon, unspecified ankle and foot: Secondary | ICD-10-CM

## 2014-10-06 DIAGNOSIS — M7660 Achilles tendinitis, unspecified leg: Secondary | ICD-10-CM

## 2014-10-06 NOTE — Progress Notes (Signed)
   Subjective:    Patient ID: Laura Glass, female    DOB: [birth year], 29 y.o.   MRN: [id number]  HPI  Patient comes in today for follow-up on left insertional Achilles tendinopathy and right knee pain. Both are improving with physical therapy. She is tolerating her topical nitroglycerin on her left Achilles tendon without any issues. She does still have pain with trying to sprint but overall feels like she has made some progress over the past 6 weeks.    Review of Systems     Objective:   Physical Exam Well-developed, fit appearing. No acute distress.  Right knee: Full range of motion. No effusion. No soft tissue swelling. Negative McMurray's. Good joint stability. No joint line tenderness. Neurovascularly intact distally.  Left heel: Still some tenderness to palpation at the insertion of the Achilles tendon onto the calcaneus. No soft tissue swelling. Negative calcaneal squeeze. No significant thickening of the Achilles tendon itself. Neurovascularly intact distally.  MSK ultrasound of the left Achilles tendon was performed. Overall there is been very little change when compared to her previous scan. There are still several areas of calcification in the distal Achilles tendon at its insertion onto the calcaneus. Once again appreciated are some small hypoechoic changes around this area consistent with small interstitial tearing.       Assessment & Plan:  Left heel insertional Achilles tendinopathy Right knee muscle strain  Continue with Ellamae Sia. Continue with topical nitroglycerin. She will discontinue her heel lifts that she has found them uncomfortable. Continue to modify activity based on pain. Follow-up with me in 8 weeks. We will repeat her ultrasound at that time. Call with questions or concerns in the interim.

## 2014-12-08 ENCOUNTER — Ambulatory Visit (INDEPENDENT_AMBULATORY_CARE_PROVIDER_SITE_OTHER): Payer: BLUE CROSS/BLUE SHIELD | Admitting: Sports Medicine

## 2014-12-08 ENCOUNTER — Encounter: Payer: Self-pay | Admitting: Sports Medicine

## 2014-12-08 VITALS — BP 138/84 | HR 69 | Ht 65.0 in | Wt 144.0 lb

## 2014-12-08 DIAGNOSIS — M67879 Other specified disorders of synovium and tendon, unspecified ankle and foot: Secondary | ICD-10-CM | POA: Diagnosis not present

## 2014-12-08 DIAGNOSIS — M7660 Achilles tendinitis, unspecified leg: Secondary | ICD-10-CM

## 2014-12-08 NOTE — Progress Notes (Signed)
   Subjective:    Patient ID: Laura RombergBrittany Heckel, female    DOB: 11-05-1985, 29 y.o.   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 She also brings up some left knee pain of 2 weeks duration.  She has been following with Dr. Ellamae SiaJohn O'Halloran in rehabilitation/physical therapy with weekly sessions for stretching, iontophoresis, and graston technique to the LEFT achilles tendon.  Also using kineso-tape as well.    She has not noticed any appreciable improvement with the use of nitroglycerin patches, which were started at the time of diagnosis 3 months ago.  She continues to participate in softball and lift weights. She reports that her LEFT knee pain started 2 weeks ago without an inciting event/injury/trauma and bothers her most with extension exercises as she reaches full extension. Pain is located in the midportion of the patellar tendon.  Her Achilles remains predominantly asymptomatic when not exercising and less she had a long softball tournament/several games the day before. Predominant symptoms come during softball playing with jumping, sprinting or cutting.  No swelling, instability or weakness in the leg. Only pain at the calcaneus/Achilles junction.  Patient's past medical, surgical medication and allergy histories were reviewed and updated in the EMR.  Review of Systems There are no other symptoms on a 10 point review of systems other than above.     Objective:   Physical Exam  General: Resting comfortably and in no acute distress Cardiopulmonary: Nonlabored respirations  Knee exam: There are no abnormalities on inspection. She was nontender to palpation along bilateral joint lines, quad tendon and patellar tendon. The patient has 5/5 strength without pain in all of her cardinal movements. She had negative collateral and cruciate ligament testing  bilaterally.  Foot/ankle exam: The LEFT calcaneus is mildly tender to aggressive palpation at the Achilles insertion. She has no abnormalities on inspection. She has 5/5 strength on all cardinal movements of the foot/ankle and is nontender to palpation of all bony landmarks other than above. She has a negative anterior drawer and talar tilt.  MUSCULOSKELETAL ULTRASOUND of the LEFT Achilles tendon: On muscle skeletal ultrasound the patient's Achilles tendon and heel cord were visualized and short and long axis. There was evidence of calcification and hypoechoic changes again demonstrated at the Achilles tendons insertion on the calcaneus; this patient confirms that this is the site of her pain during activity. The AP diameter of the Achilles tendon 2 cm proximal to the calcaneus was 0.39 cm. The transverse diameter at the same level was 1.10 cm. There was no increased Doppler flow in any of the areas above.      Assessment & Plan:  29 year old female presents for follow-up of known insertional Achilles tendinopathy  Plan: Stop nitroglycerin at this time as it is not providing noticeable benefit despite 3 months of use May use systemic NSAIDs as needed for pain control Continue with submersion ice water baths for severe pain/nighttime pain Alfredson exercises reviewed with patient today, patient will complete these and advance as instructed until follow-up appointment Continue physical therapy with Dr. Ellamae SiaJohn O'Halloran; make physical therapy aware of progression to eccentric strengthening exercises Follow-up again in 6 weeks for a repeat scan and reassessment insertional Achilles tendinopathy   ===

## 2015-01-15 ENCOUNTER — Ambulatory Visit (INDEPENDENT_AMBULATORY_CARE_PROVIDER_SITE_OTHER): Payer: BLUE CROSS/BLUE SHIELD | Admitting: Sports Medicine

## 2015-01-15 ENCOUNTER — Encounter: Payer: Self-pay | Admitting: Sports Medicine

## 2015-01-15 VITALS — BP 110/64 | Ht 64.0 in | Wt 140.0 lb

## 2015-01-15 DIAGNOSIS — M7662 Achilles tendinitis, left leg: Secondary | ICD-10-CM | POA: Diagnosis not present

## 2015-01-15 NOTE — Patient Instructions (Signed)
[facility] Lajoyce Cornersuda Thursday January 5th 2017 at 345pm [address], [place], [place] [address] Phone: [phone]

## 2015-01-15 NOTE — Progress Notes (Signed)
   Subjective:    Patient ID: Laura Glass, female    DOB: [birth year], 29 y.o.   MRN: [id number]  HPI   Patient comes in today for follow-up on left heel pain secondary to insertional Achilles tendinopathy. She continues to struggle with pain particularly with running. She has been treated with topical nitroglycerin, physical therapy, home exercise programs, and oral anti-inflammatories. Despite this her pain persists. It has been present in some form or fashion now for about 3 years. It is definitely interfering with her ability to be as active she would like.    Review of Systems     Objective:   Physical Exam Well-developed, well-nourished. No acute distress  Left heel: There is tenderness to palpation at the insertion of the Achilles tendon onto the posterior calcaneus. No soft tissue swelling. Achilles tendon does not appear to be thickened. Neurovascular intact distally.  MSK ultrasound of the posterior left heel is performed. Once again seen is calcification within the distal Achilles tendon along with a spur that comes off of the posterior calcaneus into the midportion of the Achilles tendon. 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       Assessment & Plan:  Long-standing left heel insertional Achilles tendinopathy  Patient's symptoms have been resistant to conservative treatment to date. I will refer her to Dr. Aldean BakerMarcus Duda for his input. I do think she should continue with her home exercises and I will defer further treatment to the discretion of Dr. Lajoyce Cornersuda and the patient will follow-up with me as needed.

## 2015-07-09 ENCOUNTER — Ambulatory Visit (INDEPENDENT_AMBULATORY_CARE_PROVIDER_SITE_OTHER): Payer: BLUE CROSS/BLUE SHIELD | Admitting: Physician Assistant

## 2015-07-09 VITALS — BP 122/78 | HR 66 | Temp 99.0°F | Resp 15 | Ht 64.0 in | Wt 147.3 lb

## 2015-07-09 DIAGNOSIS — Z23 Encounter for immunization: Secondary | ICD-10-CM | POA: Diagnosis not present

## 2015-07-09 DIAGNOSIS — Z021 Encounter for pre-employment examination: Secondary | ICD-10-CM

## 2015-07-09 NOTE — Patient Instructions (Signed)
     IF you received an x-ray today, you will receive an invoice from [facility]. Please contact  Radiology at [phone] with questions or concerns regarding your invoice.   IF you received labwork today, you will receive an invoice from [facility]. Please contact [facility] at [phone] with questions or concerns regarding your invoice.   Our billing staff will not be able to assist you with questions regarding bills from these companies.  You will be contacted with the lab results as soon as they are available. The fastest way to get your results is to activate your My Chart account. Instructions are located on the last page of this paperwork. If you have not heard from us regarding the results in 2 weeks, please contact this office.      

## 2015-07-09 NOTE — Progress Notes (Signed)
[facility] [address], [place] [address] [phone]  Date:  07/09/2015   Name:  Laura RombergBrittany Glass   DOB:  [birth year]   MRN:  [id number]  PCP:  No PCP Per Patient    Chief Complaint: Employment Physical   History of Present Illness:  This is a 30 y.o. female who is presenting for [place] physical for [employer]. 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   She has no complaints today. No PMH. She does not want any blood work today.  LMP: 07/02/15 Contraception: junel Last pap: January 2017 and normal Immunizations: needs tdap today Eye: does not wear glasses or contacts. No problems with vision. No problems with hearing. Diet/Exercise: She eats healthy. She plays travel softball. She travels every other weekend. Tobacco/alcohol/substance use: no/occ/no  Review of Systems:  Review of Systems See HPI  There are no active problems to display for this patient.   Prior to Admission medications   Medication Sig Start Date End Date Taking? Authorizing Provider  norethindrone-ethinyl estradiol (JUNEL FE,GILDESS FE,LOESTRIN FE) 1-20 MG-MCG tablet Take 1 tablet by mouth daily. Reported on 07/09/2015   Yes Historical Provider, MD                  No Known Allergies  History reviewed. No pertinent past surgical history.  Social History  Substance Use Topics  . Smoking status: Never Smoker   . Smokeless tobacco: None  . Alcohol Use: No    Family History  Problem Relation Age of Onset  . Diabetes Mother     Medication list has been reviewed and updated.  Physical Examination:  Physical Exam  Constitutional: She is oriented to person, place, and time. She appears well-developed and well-nourished. No distress.  HENT:  Head: Normocephalic and atraumatic.  Right Ear: Hearing normal.  Left Ear: Hearing normal.  Nose: Nose normal.  Eyes: Conjunctivae and lids are normal. Right  eye exhibits no discharge. Left eye exhibits no discharge. No scleral icterus.  Cardiovascular: Normal rate, regular rhythm, normal heart sounds and normal pulses.   No murmur heard. Pulmonary/Chest: Effort normal and breath sounds normal. No respiratory distress. She has no wheezes. She has no rhonchi. She has no rales.  Musculoskeletal: Normal range of motion.  Neurological: She is alert and oriented to person, place, and time.  Skin: Skin is warm, dry and intact. No lesion and no rash noted.  Psychiatric: She has a normal mood and affect. Her speech is normal and behavior is normal. Thought content normal.   BP 122/78 mmHg  Pulse 66  Temp(Src) 99 F (37.2 C) (Oral)  Resp 15  Ht 5\' 4"  (1.626 m)  Wt 147 lb 4.8 oz (66.815 kg)  BMI 25.27 kg/m2  SpO2 99%  LMP 07/02/2015   Visual Acuity Screening   Right eye Left eye Both eyes  Without correction: 20 20 2015  20 15  With correction:       Assessment and Plan:  1. Physical exam, pre-employment Healthy 30 year old female.  Forms filled for employment. Up to date on ppd. Needs tdap today. Return as needed  2. Need for Tdap vaccination - Tdap vaccine greater than or equal to 7yo IM   Roswell MinersNicole V. Dyke BrackettBush, PA-C, MHS [facility]  07/09/2015

## 2015-09-07 ENCOUNTER — Ambulatory Visit (INDEPENDENT_AMBULATORY_CARE_PROVIDER_SITE_OTHER): Payer: BLUE CROSS/BLUE SHIELD | Admitting: Internal Medicine

## 2015-09-07 ENCOUNTER — Encounter: Payer: Self-pay | Admitting: Internal Medicine

## 2015-09-07 VITALS — BP 114/78 | HR 57 | Temp 98.8°F | Ht 64.0 in | Wt 148.0 lb

## 2015-09-07 DIAGNOSIS — J302 Other seasonal allergic rhinitis: Secondary | ICD-10-CM | POA: Insufficient documentation

## 2015-09-07 NOTE — Progress Notes (Signed)
HPI  Pt presents to the clinic today to establish care and for management of the conditions listed below.  Seasonal allergies: Can occur any time during the year. She takes OTC decongestants with good relief.   Flu: never Tetanus: 06/2015 Pap Smear: 10/2014 Dentist: as needed  Past Medical History:  Diagnosis Date  . Allergy   . History of chicken pox     Current Outpatient Prescriptions  Medication Sig Dispense Refill  . norethindrone-ethinyl estradiol (JUNEL FE,GILDESS FE,LOESTRIN FE) 1-20 MG-MCG tablet Take 1 tablet by mouth daily. Reported on 07/09/2015     No current facility-administered medications for this visit.     No Known Allergies  Family History  Problem Relation Age of Onset  . Diabetes Mother   . Hypertension Mother   . Hypertension Father     Social History   Social History  . Marital status: Single    Spouse name: N/A  . Number of children: N/A  . Years of education: N/A   Occupational History  . Not on file.   Social History Main Topics  . Smoking status: Never Smoker  . Smokeless tobacco: Never Used  . Alcohol use Yes     Comment: occasional  . Drug use: No  . Sexual activity: Yes    Birth control/ protection: Pill   Other Topics Concern  . Not on file   Social History Narrative  . No narrative on file    ROS:  Constitutional: Denies fever, malaise, fatigue, headache or abrupt weight changes.  HEENT: Denies eye pain, eye redness, ear pain, ringing in the ears, wax buildup, runny nose, nasal congestion, bloody nose, or sore throat. Respiratory: Denies difficulty breathing, shortness of breath, cough or sputum production.   Cardiovascular: Denies chest pain, chest tightness, palpitations or swelling in the hands or feet.  Gastrointestinal: Denies abdominal pain, bloating, constipation, diarrhea or blood in the stool.  GU: Denies frequency, urgency, pain with urination, blood in urine, odor or discharge. Musculoskeletal: Denies  decrease in range of motion, difficulty with gait, muscle pain or joint pain and swelling.  Skin: Denies redness, rashes, lesions or ulcercations.  Neurological: Denies dizziness, difficulty with memory, difficulty with speech or problems with balance and coordination.  Psych: Denies anxiety, depression, SI/HI.  No other specific complaints in a complete review of systems (except as listed in HPI above).  PE:  BP 114/78   Pulse (!) 57   Temp 98.8 F (37.1 C) (Oral)   Ht 5\' 4"  (1.626 m)   Wt 148 lb (67.1 kg)   LMP 08/25/2015 (Exact Date)   SpO2 98%   BMI 25.40 kg/m  Wt Readings from Last 3 Encounters:  09/07/15 148 lb (67.1 kg)  07/09/15 147 lb 4.8 oz (66.8 kg)  01/15/15 140 lb (63.5 kg)    General: Appears her stated age, well developed, well nourished in NAD. Skin: Dry and intact. Cardiovascular: Normal rate and rhythm. S1,S2 noted.  No murmur, rubs or gallops noted. . Pulmonary/Chest: Normal effort and positive vesicular breath sounds. No respiratory distress. No wheezes, rales or ronchi noted.  Neurological: Alert and oriented.  Psychiatric: Mood and affect normal. Behavior is normal. Judgment and thought content normal.    Assessment and Plan:  Make an appt 06/2016 for your annual exam

## 2015-09-07 NOTE — Patient Instructions (Signed)
Allergic Rhinitis Allergic rhinitis is when the mucous membranes in the nose respond to allergens. Allergens are particles in the air that cause your body to have an allergic reaction. This causes you to release allergic antibodies. Through a chain of events, these eventually cause you to release histamine into the blood stream. Although meant to protect the body, it is this release of histamine that causes your discomfort, such as frequent sneezing, congestion, and an itchy, runny nose.  CAUSES Seasonal allergic rhinitis (hay fever) is caused by pollen allergens that may come from grasses, trees, and weeds. Year-round allergic rhinitis (perennial allergic rhinitis) is caused by allergens such as house dust mites, pet dander, and mold spores. SYMPTOMS  Nasal stuffiness (congestion).  Itchy, runny nose with sneezing and tearing of the eyes. DIAGNOSIS Your health care provider can help you determine the allergen or allergens that trigger your symptoms. If you and your health care provider are unable to determine the allergen, skin or blood testing may be used. Your health care provider will diagnose your condition after taking your health history and performing a physical exam. Your health care provider may assess you for other related conditions, such as asthma, pink eye, or an ear infection. TREATMENT Allergic rhinitis does not have a cure, but it can be controlled by:  Medicines that block allergy symptoms. These may include allergy shots, nasal sprays, and oral antihistamines.  Avoiding the allergen. Hay fever may often be treated with antihistamines in pill or nasal spray forms. Antihistamines block the effects of histamine. There are over-the-counter medicines that may help with nasal congestion and swelling around the eyes. Check with your health care provider before taking or giving this medicine. If avoiding the allergen or the medicine prescribed do not work, there are many new medicines  your health care provider can prescribe. Stronger medicine may be used if initial measures are ineffective. Desensitizing injections can be used if medicine and avoidance does not work. Desensitization is when a patient is given ongoing shots until the body becomes less sensitive to the allergen. Make sure you follow up with your health care provider if problems continue. HOME CARE INSTRUCTIONS It is not possible to completely avoid allergens, but you can reduce your symptoms by taking steps to limit your exposure to them. It helps to know exactly what you are allergic to so that you can avoid your specific triggers. SEEK MEDICAL CARE IF:  You have a fever.  You develop a cough that does not stop easily (persistent).  You have shortness of breath.  You start wheezing.  Symptoms interfere with normal daily activities.   This information is not intended to replace advice given to you by your health care provider. Make sure you discuss any questions you have with your health care provider.   Document Released: 10/05/2000 Document Revised: 01/31/2014 Document Reviewed: 09/17/2012 Elsevier Interactive Patient Education 2016 Elsevier Inc.  

## 2015-09-07 NOTE — Assessment & Plan Note (Signed)
Continue OTC decongestants prn

## 2015-11-23 ENCOUNTER — Encounter: Payer: Self-pay | Admitting: Internal Medicine

## 2015-11-23 ENCOUNTER — Ambulatory Visit (INDEPENDENT_AMBULATORY_CARE_PROVIDER_SITE_OTHER): Payer: BC Managed Care – PPO | Admitting: Internal Medicine

## 2015-11-23 VITALS — BP 118/80 | HR 73 | Temp 98.8°F | Wt 144.8 lb

## 2015-11-23 DIAGNOSIS — J329 Chronic sinusitis, unspecified: Secondary | ICD-10-CM | POA: Diagnosis not present

## 2015-11-23 DIAGNOSIS — B9789 Other viral agents as the cause of diseases classified elsewhere: Secondary | ICD-10-CM | POA: Diagnosis not present

## 2015-11-23 MED ORDER — PREDNISONE 10 MG PO TABS: ORAL_TABLET | ORAL | 0 refills | Status: DC

## 2015-11-23 NOTE — Progress Notes (Signed)
HPI  Pt presents to the clinic today with c/o headache, nasal congestion and sore throat. This started 1 week ago. The headache is located in her forehead and the base of her skull. She describes the headache as pressure. She has had some ear fullness but denies ear pain. She is blowing yellow/green mucous out of her nose. She denies difficulty swallowing. She cough, chest congestion or shortness of breath. She has tried Nyquil, Tylenol Cold and Flu and a decongestant with minimal relief.  Review of Systems    Past Medical History:  Diagnosis Date  . Allergy   . History of chicken pox     Family History  Problem Relation Age of Onset  . Diabetes Mother   . Hypertension Mother   . Hypertension Father     Social History   Social History  . Marital status: Single    Spouse name: N/A  . Number of children: N/A  . Years of education: N/A   Occupational History  . Not on file.   Social History Main Topics  . Smoking status: Never Smoker  . Smokeless tobacco: Never Used  . Alcohol use Yes     Comment: occasional  . Drug use: No  . Sexual activity: Yes    Birth control/ protection: Pill   Other Topics Concern  . Not on file   Social History Narrative  . No narrative on file    No Known Allergies   Constitutional: Positive headache. Denies fatigue, fever or abrupt weight changes.  HEENT:  Positive facial pain, nasal congestion and sore throat. Denies eye redness, ear pain, ringing in the ears, wax buildup, runny nose or bloody nose. Respiratory: Denies cough, difficulty breathing or shortness of breath.  Cardiovascular: Denies chest pain, chest tightness, palpitations or swelling in the hands or feet.   No other specific complaints in a complete review of systems (except as listed in HPI above).  Objective:   BP 118/80   Pulse 73   Temp 98.8 F (37.1 C) (Oral)   Wt 144 lb 12 oz (65.7 kg)   LMP 11/16/2015   SpO2 100%   BMI 24.85 kg/m   General: Appears her  stated age, in NAD. HEENT: Head: normal shape and size, maxillary sinus tenderness noted; Eyes: sclera white, no icterus, conjunctiva pink; Ears: Tm's pink but intact, normal light reflex; Nose: mucosa boggy and moist, septum midline; Throat/Mouth: + PND. Teeth present, mucosa erythematous and moist, no exudate noted, no lesions or ulcerations noted.  Neck:  No adenopathy noted.  Cardiovascular: Normal rate and rhythm. S1,S2 noted.  No murmur, rubs or gallops noted.  Pulmonary/Chest: Normal effort and positive vesicular breath sounds. No respiratory distress. No wheezes, rales or ronchi noted.      Assessment & Plan:   Viral sinusitis  Can use a Neti Pot which can be purchased from your local drug store. eRx for pred taper x 6 days  RTC as needed or if symptoms persist. Nicki ReaperBAITY, Isacc Turney, NP

## 2015-11-23 NOTE — Patient Instructions (Signed)

## 2016-02-23 ENCOUNTER — Ambulatory Visit: Payer: BC Managed Care – PPO | Admitting: Internal Medicine

## 2016-02-24 ENCOUNTER — Ambulatory Visit (INDEPENDENT_AMBULATORY_CARE_PROVIDER_SITE_OTHER): Payer: BC Managed Care – PPO | Admitting: Family Medicine

## 2016-02-24 ENCOUNTER — Encounter: Payer: Self-pay | Admitting: Family Medicine

## 2016-02-24 VITALS — BP 116/74 | HR 62 | Temp 97.8°F | Wt 151.5 lb

## 2016-02-24 DIAGNOSIS — M25511 Pain in right shoulder: Secondary | ICD-10-CM | POA: Diagnosis not present

## 2016-02-24 MED ORDER — MELOXICAM 15 MG PO TABS: 15.0000 mg | ORAL_TABLET | Freq: Every day | ORAL | 1 refills | Status: DC

## 2016-02-24 NOTE — Assessment & Plan Note (Signed)
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

## 2016-02-24 NOTE — Patient Instructions (Signed)
Use ice whenever you can for 10 minutes at a time  Relative rests from sports and heavy lifting  So the passive finger /wall exercise to keep from getting stiff several times daily  Let's get an appt. With Dr Patsy Lageropland (sport med)   meloxicam 15 mg daily with food to help pain and inflammation

## 2016-02-24 NOTE — Progress Notes (Signed)
   Subjective:    Patient ID: Laura Glass, female    DOB: 12-18-1985, 31 y.o.   MRN: [id number]  HPI 31 yo healthy female presents with R shoulder pain   Started Saturday in a softball tournament  R handed  Was throwing - (outfield)  Started to ache after her first game - then it became difficult to use her arm   Pain started in anterior shoulder  Now it is moving back to her shoulder blade (more than usual)  Sharp pains with a burning sensation and achey since then  Today is in the back of her shoulder   Extending shoulder or reaching back is the worse Hard to drive or raise arm to write on the board   Shoulder is normally swollen just a bit   Has been taking ibuprofen - 2 pills once a day  Has been icing it  Has laid off lifting     She has had an MRI in the past - showed a little swelling  Small tears were seen 6-7 years ago  No surgery needed  No cortisone shots  Considered overuse injury      Review of Systems     Objective:   Physical Exam  Constitutional: She appears well-developed and well-nourished. No distress.  Well athletic appearing female    HENT:  Head: Normocephalic and atraumatic.  Eyes: Conjunctivae and EOM are normal. Pupils are equal, round, and reactive to light.  Neck: Normal range of motion. Neck supple.  Cardiovascular: Normal rate and regular rhythm.   Musculoskeletal:       Right shoulder: She exhibits decreased range of motion, tenderness and bony tenderness. She exhibits no swelling, no effusion, no crepitus, no deformity and normal pulse.  R shoulder: pain upon abducting over 100 degrees  Good rom with some pain on internal rotation  Mild acromion tenderness No bicep tenderness  No crepitus/swelling /rubor or erythema  Some tenderness in rhomboid area/ medial to scapula  Nl rom neck w/o increased pain   Pos Hawking test for ant shoulder pain Neg Neer test   No neuro changes    Lymphadenopathy:    She has no cervical  adenopathy.  Neurological: She displays no atrophy. No sensory deficit. She exhibits normal muscle tone. Coordination normal.  Skin: Skin is warm and dry. No rash noted. No erythema.  Psychiatric: She has a normal mood and affect.          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

## 2016-03-02 ENCOUNTER — Ambulatory Visit (INDEPENDENT_AMBULATORY_CARE_PROVIDER_SITE_OTHER): Payer: BC Managed Care – PPO | Admitting: Family Medicine

## 2016-03-02 ENCOUNTER — Encounter: Payer: Self-pay | Admitting: Family Medicine

## 2016-03-02 VITALS — BP 120/80 | HR 67 | Temp 98.8°F | Ht 64.0 in | Wt 153.0 lb

## 2016-03-02 DIAGNOSIS — M7581 Other shoulder lesions, right shoulder: Secondary | ICD-10-CM

## 2016-03-02 DIAGNOSIS — M24811 Other specific joint derangements of right shoulder, not elsewhere classified: Secondary | ICD-10-CM | POA: Diagnosis not present

## 2016-03-02 DIAGNOSIS — M25511 Pain in right shoulder: Secondary | ICD-10-CM

## 2016-03-02 NOTE — Progress Notes (Signed)
Pre visit review using our clinic review tool, if applicable. No additional management support is needed unless otherwise documented below in the visit note. 

## 2016-03-02 NOTE — Patient Instructions (Signed)
[place]

## 2016-03-02 NOTE — Progress Notes (Signed)
Dr. Karleen HampshireSpencer T. Nena Hampe, MD, CAQ Sports Medicine Primary Care and Sports Medicine [address] [place], [id number] Phone: [phone] Fax: [phone]  03/02/2016  Patient: Laura Glass, MRN: [id number], DOB: [birth year], 31 y.o.  Primary Physician:  Nicki ReaperBAITY, REGINA, NP   Chief Complaint  Patient presents with  . Shoulder Pain    Right   Subjective:   Laura Glass is a 31 y.o. 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  Prior to this she has had some intermittent shoulder pain off and on for at least 7 years.  7 years [facility], [facility] and was evaluated by in unclear physician, she had an MRI of her right shoulder which by report was grossly normal.  She reports no interventions, and she has been able to continue to play softball with occasional pain.  Typically she plays in the outfield.   No history of dislocation, subluxation, or operative intervention as well as no fracture on the right shoulder.  She is very physically active and also lifts weights and runs and is in excellent shape.  No numbness.  RHD.   Past Medical History, Surgical History, Social History, Family History, Problem List, Medications, and Allergies have been reviewed and updated if relevant.  Patient Active Problem List   Diagnosis Date Noted  . Right shoulder pain 02/24/2016  . Seasonal allergies 09/07/2015    Past Medical History:  Diagnosis Date  . Allergy   . History of chicken pox     Past Surgical History:  Procedure Laterality Date  . WISDOM TOOTH EXTRACTION      Social History   Social History  . Marital status: Single    Spouse name: N/A  . Number of children: N/A  . Years of education: N/A   Occupational  History  . Not on file.   Social History Main Topics  . Smoking status: Never Smoker  . Smokeless tobacco: Never Used  . Alcohol use Yes     Comment: occasional  . Drug use: No  . Sexual activity: Yes    Birth control/ protection: Pill   Other Topics Concern  . Not on file   Social History Narrative  . No narrative on file    Family History  Problem Relation Age of Onset  . Diabetes Mother   . Hypertension Mother   . Hypertension Father     No Known Allergies  Medication list reviewed and updated in full in [facility].  GEN: No fevers, chills. Nontoxic. Primarily MSK c/o today. MSK: Detailed in the HPI GI: tolerating PO intake without difficulty Neuro: No numbness, parasthesias, or tingling associated. Otherwise the pertinent positives of the ROS are noted above.   Objective:   BP 120/80   Pulse 67   Temp 98.8 F (37.1 C) (Oral)   Ht 5\' 4"  (1.626 m)   Wt 153 lb (69.4 kg)   LMP 02/09/2016   BMI 26.26 kg/m    GEN: WDWN, NAD, Non-toxic, Alert & Oriented x 3 HEENT: Atraumatic, Normocephalic.  Ears and Nose: No external deformity. EXTR: No clubbing/cyanosis/edema NEURO: Normal gait.  PSYCH: Normally interactive. Conversant. Not depressed or anxious appearing.  Calm demeanor.   Shoulder: R Inspection: No muscle wasting or winging Ecchymosis/edema: neg  AC joint, scapula, clavicle:  NT Cervical spine: NT, full ROM Abduction: full, 5/5, mild painful arc of motion Flexion: full, 5/5 IR, 90 deg of IROM at 90 deg abd, lift-off: 5/5 ER at neutral: full, 5/5 AC crossover and compression: minimal pos Neer: neg Hawkins: mild pos Drop Test: neg Empty Can: neg Supraspinatus insertion: NT Bicipital groove: NT Sulcus sign: neg Apprehension: neg O'Brien's: POS Jobe Relocation: POS Crank: POS CLUNK POS Load and shift laxity: MINIMAL Scapular dyskinesis: none   Rhomboid str 4/5 R, 4+/5 on L Middle traps 4/5 R, 4+/5 on L Lower traps 4+/5  B  Radiology: No results found.  Assessment and Plan:   Internal derangement of right shoulder  Right shoulder pain, unspecified chronicity - Plan: Ambulatory referral to Physical Therapy  Rotator cuff tendonitis, right   >25 minutes spent in face to face time with patient, >50% spent in counselling or coordination of care   Examination is highly suggestive of labral tear.  This would correlate well with her history as well as some dynamic pain with throwing.  Shoulder is not grossly unstable. 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  We reviewed relevant anatomy.  As a softball player, she has excellent understanding of what is involved and has several friends who have had labral tears.  There is a good chance that she will do okay with conservative management.  Formal physical therapy for assistance with scapular stabilization and formulation of shoulder program for softball player.  The patient has seen Ellamae Sia in the past, so we will consult for his assistance. Will start her with [place] and Sleeper stretching.   If symptoms persist, and she cannot play softball and is unable to throw and consideration of MR arthrogram versus direct shoulder surgical consultation would be reasonable.  Follow-up: Return in about 2 months (around 04/30/2016).  There are no discontinued medications. Orders Placed This Encounter  Procedures  . Ambulatory referral to Physical Therapy    Signed,  Karleen Hampshire T. Tamim Skog, MD   Allergies as of 03/02/2016   No Known Allergies     Medication List       Accurate as of 03/02/16 11:59 PM. Always use your most recent med list.          meloxicam 15 MG tablet Commonly known as:  MOBIC Take 1 tablet (15 mg total) by mouth daily. Take with a meal   norethindrone-ethinyl estradiol 1-20 MG-MCG tablet Commonly known as:  JUNEL FE,GILDESS FE,LOESTRIN FE Take 1 tablet by mouth daily. Reported on 07/09/2015

## 2016-03-09 ENCOUNTER — Encounter: Payer: Self-pay | Admitting: Internal Medicine

## 2016-04-27 ENCOUNTER — Encounter: Payer: Self-pay | Admitting: Family Medicine

## 2016-04-27 ENCOUNTER — Ambulatory Visit (INDEPENDENT_AMBULATORY_CARE_PROVIDER_SITE_OTHER): Payer: BC Managed Care – PPO | Admitting: Family Medicine

## 2016-04-27 VITALS — BP 120/80 | HR 76 | Temp 98.5°F | Ht 64.0 in | Wt 154.5 lb

## 2016-04-27 DIAGNOSIS — M7581 Other shoulder lesions, right shoulder: Secondary | ICD-10-CM

## 2016-04-27 NOTE — Progress Notes (Signed)
Dr. Karleen Hampshire T. Leila Schuff, MD, CAQ Sports Medicine Primary Care and Sports Medicine [address] [place], [address] Phone: [phone] Fax: [phone]  04/27/2016  Patient: Laura Glass, MRN: [id number], DOB: Sep 06, 1985, 31 y.o.  Primary Physician:  Nicki Reaper, NP   Chief Complaint  Patient presents with  . Follow-up    Right Shoulder   Subjective:   Dorthea Glass is a 31 y.o. very pleasant female patient who presents with the following:  Pain is about 70% better.   She has been working with physical therapy once a week, and she is been doing her home rehab exercises almost every day.  She has been able to continue playing softball, and she is planning any outfield without any difficulty, even playing tournaments.  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  Prior to this she has had some intermittent shoulder pain off and on for at least 7 years.  7 years ago, she went to [facility]oped[facility] and was evaluated by in unclear physician, she had an MRI of her right shoulder which by report was grossly normal.  She reports no interventions, and she has been able to continue to play softball with occasional pain.  Typically she plays in the outfield.   No history of dislocation, subluxation, or operative intervention as well as no fracture on the right shoulder.  She is very physically active and also lifts weights and runs and is in excellent shape.  No numbness.  RHD.   Past Medical History, Surgical History, Social History, Family History, Problem List, Medications, and Allergies have been reviewed and updated if relevant.  Patient Active Problem List   Diagnosis Date Noted  . Right shoulder pain 02/24/2016  . Seasonal allergies 09/07/2015     Past Medical History:  Diagnosis Date  . Allergy   . History of chicken pox     Past Surgical History:  Procedure Laterality Date  . WISDOM TOOTH EXTRACTION      Social History   Social History  . Marital status: Single    Spouse name: N/A  . Number of children: N/A  . Years of education: N/A   Occupational History  . Not on file.   Social History Main Topics  . Smoking status: Never Smoker  . Smokeless tobacco: Never Used  . Alcohol use Yes     Comment: occasional  . Drug use: No  . Sexual activity: Yes    Birth control/ protection: Pill   Other Topics Concern  . Not on file   Social History Narrative  . No narrative on file    Family History  Problem Relation Age of Onset  . Diabetes Mother   . Hypertension Mother   . Hypertension Father     No Known Allergies  Medication list reviewed and updated in full in [facility].  GEN: No fevers, chills. Nontoxic. Primarily MSK c/o today. MSK: Detailed in the HPI GI: tolerating PO intake without difficulty Neuro: No numbness, parasthesias, or tingling associated. Otherwise the pertinent positives of the ROS are noted above.   Objective:   BP 120/80   Pulse 76   Temp 98.5 F (36.9 C) (Oral)   Ht  (1.626 m)   Wt 154 lb 8 oz (70.1 kg)   LMP 04/04/2016  BMI 26.52 kg/m    GEN: WDWN, NAD, Non-toxic, Alert & Oriented x 3 HEENT: Atraumatic, Normocephalic.  Ears and Nose: No external deformity. EXTR: No clubbing/cyanosis/edema NEURO: Normal gait.  PSYCH: Normally interactive. Conversant. Not depressed or anxious appearing.  Calm demeanor.   Shoulder: R Inspection: No muscle wasting or winging Ecchymosis/edema: neg  AC joint, scapula, clavicle: NT Cervical spine: NT, full ROM Spurling's: neg Abduction: full, 5/5 Flexion: full, 5/5 IR, full, lift-off: 5/5, IROM on the R is approx 45 deg less than the L ER at neutral: full, 5/5 AC crossover and compression: neg Neer: neg Hawkins:  neg Drop Test: neg Empty Can: neg Supraspinatus insertion: NT Bicipital groove: NT Speed's: neg Yergason's: neg Sulcus sign: neg Scapular dyskinesis: none C5-T1 intact Sensation intact Grip 5/5   Radiology: No results found.  Assessment and Plan:   Rotator cuff tendonitis, right  She is mostly better, and back to return to play.  I think that much of her symptoms are secondary to throwing with  GIRD (Glenohumeral Internal Rotational Deficiency) and I would anticipate that she gets better motion over the following few months.  Follow-up: No Follow-up on file.  Signed,  Elpidio Galea. Nessa Ramaker, MD   Allergies as of 04/27/2016   No Known Allergies     Medication List       Accurate as of 04/27/16 11:59 PM. Always use your most recent med list.          meloxicam 15 MG tablet Commonly known as:  MOBIC Take 1 tablet (15 mg total) by mouth daily. Take with a meal   norethindrone-ethinyl estradiol 1-20 MG-MCG tablet Commonly known as:  JUNEL FE,GILDESS FE,LOESTRIN FE Take 1 tablet by mouth daily. Reported on 07/09/2015

## 2016-04-27 NOTE — Progress Notes (Signed)
Pre visit review using our clinic review tool, if applicable. No additional management support is needed unless otherwise documented below in the visit note. 

## 2016-05-19 ENCOUNTER — Ambulatory Visit (INDEPENDENT_AMBULATORY_CARE_PROVIDER_SITE_OTHER): Payer: BC Managed Care – PPO | Admitting: Primary Care

## 2016-05-19 ENCOUNTER — Encounter: Payer: Self-pay | Admitting: Primary Care

## 2016-05-19 VITALS — BP 136/74 | HR 67 | Temp 98.3°F | Ht 64.0 in | Wt 155.8 lb

## 2016-05-19 DIAGNOSIS — J302 Other seasonal allergic rhinitis: Secondary | ICD-10-CM

## 2016-05-19 MED ORDER — MOMETASONE FUROATE 50 MCG/ACT NA SUSP: 2.0000 | Freq: Every day | NASAL | 1 refills | Status: DC

## 2016-05-19 MED ORDER — OLOPATADINE HCL 0.1 % OP SOLN: 1.0000 [drp] | Freq: Two times a day (BID) | OPHTHALMIC | 1 refills | Status: DC

## 2016-05-19 NOTE — Progress Notes (Signed)
Pre visit review using our clinic review tool, if applicable. No additional management support is needed unless otherwise documented below in the visit note. 

## 2016-05-19 NOTE — Patient Instructions (Signed)
You may use the eye drops twice daily as needed.  Nasal Congestion/Sinus Pressure: Use Nasonex nasal spray. Instill 1 spray in each nostril twice daily OR 2 sprays into each nostril once daily.  Continue the antihistamine.  It was a pleasure to see you today!

## 2016-05-19 NOTE — Assessment & Plan Note (Signed)
Symptoms consistent with allergic rhinitis.  Exam without evidence of bacterial/viral process. Rx for Nasonex and patanol sent to pharmacy.  Continue antihistamine.  Avoid triggers.

## 2016-05-19 NOTE — Progress Notes (Signed)
Subjective:    Patient ID: Laura Glass, female    DOB: 1985-05-31, 31 y.o.   MRN: [id number]  HPI  Laura Glass is a 31 year old female with a history of seasonal allergies who presents today with a chief complaint nasal congestion. She also reports itchy/watery eyes, scratchy throat, mild sinus pressure. Her symptoms began several weeks ago. She denies cough, sore throat, fevers. She's taken OTC antihistamines without much improvement. She's been around her friend's cat lately and she thinks this has caused an increase in general seasonal allergy symptoms.  Review of Systems  Constitutional: Negative for chills, fatigue and fever.  HENT: Positive for congestion, sinus pressure and sneezing. Negative for ear pain and sore throat.        Itchy ears  Eyes: Positive for itching.       Watery eyes  Respiratory: Negative for cough and shortness of breath.   Cardiovascular: Negative for chest pain.  Allergic/Immunologic: Positive for environmental allergies.       Past Medical History:  Diagnosis Date  . Allergy   . History of chicken pox      Social History   Social History  . Marital status: Single    Spouse name: N/A  . Number of children: N/A  . Years of education: N/A   Occupational History  . Not on file.   Social History Main Topics  . Smoking status: Never Smoker  . Smokeless tobacco: Never Used  . Alcohol use Yes     Comment: occasional  . Drug use: No  . Sexual activity: Yes    Birth control/ protection: Pill   Other Topics Concern  . Not on file   Social History Narrative  . No narrative on file    Past Surgical History:  Procedure Laterality Date  . WISDOM TOOTH EXTRACTION      Family History  Problem Relation Age of Onset  . Diabetes Mother   . Hypertension Mother   . Hypertension Father     No Known Allergies  Current Outpatient Prescriptions on File Prior to Visit  Medication Sig Dispense Refill  . norethindrone-ethinyl estradiol (JUNEL  FE,GILDESS FE,LOESTRIN FE) 1-20 MG-MCG tablet Take 1 tablet by mouth daily. Reported on 07/09/2015    . meloxicam (MOBIC) 15 MG tablet Take 1 tablet (15 mg total) by mouth daily. Take with a meal (Patient not taking: Reported on 05/19/2016) 30 tablet 1   No current facility-administered medications on file prior to visit.     BP 136/74   Pulse 67   Temp 98.3 F (36.8 C) (Oral)   Ht  (1.626 m)   Wt 155 lb 12.8 oz (70.7 kg)   LMP 05/05/2016   SpO2 98%   BMI 26.74 kg/m    Objective:   Physical Exam  Constitutional: She appears well-nourished. She does not appear ill.  HENT:  Right Ear: Tympanic membrane and ear canal normal.  Left Ear: Tympanic membrane and ear canal normal.  Nose: Mucosal edema present. Right sinus exhibits no maxillary sinus tenderness and no frontal sinus tenderness. Left sinus exhibits no maxillary sinus tenderness and no frontal sinus tenderness.  Mouth/Throat: Oropharynx is clear and moist.  Eyes: Conjunctivae are normal.  Neck: Neck supple.  Cardiovascular: Normal rate and regular rhythm.   Pulmonary/Chest: Effort normal and breath sounds normal. She has no wheezes. She has no rales.  Lymphadenopathy:    She has no cervical adenopathy.  Skin: Skin is warm and dry.  Assessment & Plan:

## 2017-02-28 HISTORY — PX: KNEE SURGERY: SHX244

## 2017-03-07 ENCOUNTER — Other Ambulatory Visit (HOSPITAL_COMMUNITY): Payer: Self-pay | Admitting: Specialist

## 2017-03-07 ENCOUNTER — Emergency Department (HOSPITAL_COMMUNITY)
Admission: EM | Admit: 2017-03-07 | Discharge: 2017-03-07 | Disposition: A | Discharge status: Home / Self Care | Payer: BC Managed Care – PPO | Attending: Emergency Medicine | Admitting: Emergency Medicine

## 2017-03-07 ENCOUNTER — Encounter (HOSPITAL_COMMUNITY): Payer: Self-pay | Admitting: Emergency Medicine

## 2017-03-07 ENCOUNTER — Ambulatory Visit (HOSPITAL_BASED_OUTPATIENT_CLINIC_OR_DEPARTMENT_OTHER)
Admission: RE | Admit: 2017-03-07 | Discharge: 2017-03-07 | Disposition: A | Discharge status: Home / Self Care | Payer: BC Managed Care – PPO | Source: Ambulatory Visit

## 2017-03-07 DIAGNOSIS — M7989 Other specified soft tissue disorders: Secondary | ICD-10-CM | POA: Diagnosis not present

## 2017-03-07 DIAGNOSIS — I824Z2 Acute embolism and thrombosis of unspecified deep veins of left distal lower extremity: Secondary | ICD-10-CM | POA: Insufficient documentation

## 2017-03-07 DIAGNOSIS — Z4789 Encounter for other orthopedic aftercare: Secondary | ICD-10-CM

## 2017-03-07 DIAGNOSIS — M79605 Pain in left leg: Secondary | ICD-10-CM

## 2017-03-07 DIAGNOSIS — Z79899 Other long term (current) drug therapy: Secondary | ICD-10-CM | POA: Insufficient documentation

## 2017-03-07 DIAGNOSIS — I82442 Acute embolism and thrombosis of left tibial vein: Secondary | ICD-10-CM

## 2017-03-07 DIAGNOSIS — I82492 Acute embolism and thrombosis of other specified deep vein of left lower extremity: Secondary | ICD-10-CM | POA: Insufficient documentation

## 2017-03-07 DIAGNOSIS — Z7982 Long term (current) use of aspirin: Secondary | ICD-10-CM | POA: Insufficient documentation

## 2017-03-07 DIAGNOSIS — M79662 Pain in left lower leg: Secondary | ICD-10-CM | POA: Diagnosis present

## 2017-03-07 MED ORDER — RIVAROXABAN 20 MG PO TABS: 20.0000 mg | ORAL_TABLET | Freq: Every day | ORAL | Status: DC

## 2017-03-07 MED ORDER — RIVAROXABAN (XARELTO) VTE STARTER PACK (15 & 20 MG): ORAL_TABLET | ORAL | 0 refills | Status: DC

## 2017-03-07 MED ORDER — RIVAROXABAN 15 MG PO TABS
15.0000 mg | ORAL_TABLET | Freq: Two times a day (BID) | ORAL | Status: DC
  Administered 2017-03-07: 15 mg via ORAL
  Filled 2017-03-07 (×2): qty 1

## 2017-03-07 MED ORDER — HYDROCODONE-ACETAMINOPHEN 5-325 MG PO TABS
1.0000 | ORAL_TABLET | Freq: Once | ORAL | Status: AC
  Administered 2017-03-07: 1 via ORAL
  Filled 2017-03-07: qty 1

## 2017-03-07 NOTE — ED Provider Notes (Signed)
[facility] EMERGENCY DEPARTMENT Provider Note   CSN: [id number] Arrival date & time: 03/07/17  1733     History   Chief Complaint Chief Complaint  Patient presents with  . Leg Pain    pos DVT    HPI Laura Glass is a 32 y.o. female presents today for evaluation of acute onset, progressively worsening left knee and calf pain for 7 days.  One week ago she underwent left knee surgery for repair of patellofemoral syndrome.  She states that since then she has been having progressively worsening swelling and pain of the knee extending into the calf.  She notes tingling of the lower extremity from the knee down to her toes.  She denies numbness or weakness.  No fevers or chills.  She denies chest pain or shortness of breath.  She was taking Norco initially but has switched to ibuprofen and Tylenol which has not provided significant relief.  She is also been icing the knee.  She followed up with Dr. Thomasena Edis her orthopedist today who sent the patient to the ED for DVT rule out.  She is also taking OCPs.  No hemoptysis.  No prior history of DVT or PE.  The history is provided by the patient.    Past Medical History:  Diagnosis Date  . Allergy   . History of chicken pox     Patient Active Problem List   Diagnosis Date Noted  . Right shoulder pain 02/24/2016  . Seasonal allergies 09/07/2015    Past Surgical History:  Procedure Laterality Date  . KNEE SURGERY Left 02/28/2017  . WISDOM TOOTH EXTRACTION      OB History    No data available       Home Medications    Prior to Admission medications   Medication Sig Start Date End Date Taking? Authorizing Provider  acetaminophen (TYLENOL) 325 MG tablet Take 325-650 mg by mouth every 6 (six) hours as needed (for pain).   Yes [provider]  aspirin EC 81 MG tablet Take 81 mg by mouth daily.   Yes [provider]  mometasone (NASONEX) 50 MCG/ACT nasal spray Place 2 sprays into the nose  daily. Patient taking differently: Place 2 sprays into the nose daily as needed (for seasonal allergies).  05/19/16  Yes Doreene Nest, NP  norethindrone-ethinyl estradiol-iron (MICROGESTIN FE,GILDESS FE,LOESTRIN FE) 1.5-30 MG-MCG tablet Take 1 tablet by mouth daily.   Yes [provider]  olopatadine (PATANOL) 0.1 % ophthalmic solution Place 1 drop into both eyes 2 (two) times daily. Patient taking differently: Place 1 drop into both eyes daily as needed for allergies.  05/19/16  Yes Doreene Nest, NP  meloxicam (MOBIC) 15 MG tablet Take 1 tablet (15 mg total) by mouth daily. Take with a meal Patient not taking: Reported on 03/07/2017 02/24/16   Tower, Audrie Gallus, MD  Rivaroxaban 15 & 20 MG TBPK Take as directed on package: Start with one 15mg  tablet by mouth twice a day with food. On Day 22, switch to one 20mg  tablet once a day with food. 03/07/17   Jeanie Sewer, PA-C    Family History Family History  Problem Relation Age of Onset  . Diabetes Mother   . Hypertension Mother   . Hypertension Father     Social History Social History   Tobacco Use  . Smoking status: Never Smoker  . Smokeless tobacco: Never Used  Substance Use Topics  . Alcohol use: Yes    Comment:  occasional  . Drug use: No     Allergies   Patient has no known allergies.   Review of Systems Review of Systems  Constitutional: Negative for chills and fever.  Respiratory: Negative for shortness of breath.   Cardiovascular: Negative for chest pain.  Gastrointestinal: Negative for abdominal pain, nausea and vomiting.  Musculoskeletal: Positive for arthralgias and joint swelling.  Neurological: Negative for syncope, weakness and numbness.  All other systems reviewed and are negative.    Physical Exam Updated Vital Signs BP (!) 142/82 (BP Location: Right Arm)   Pulse 71   Temp 98.3 F (36.8 C) (Oral)   Resp 16   Ht 5\' 4"  (1.626 m)   Wt 70.3 kg (155 lb)   LMP 02/21/2017   SpO2 99%   BMI  26.61 kg/m   Physical Exam  Constitutional: She is oriented to person, place, and time. She appears well-developed and well-nourished. No distress.  Resting comfortably in chair  HENT:  Head: Normocephalic and atraumatic.  Eyes: Conjunctivae are normal. Right eye exhibits no discharge. Left eye exhibits no discharge.  Neck: No JVD present. No tracheal deviation present.  Cardiovascular: Normal rate, regular rhythm, normal heart sounds and intact distal pulses.  2+ radial and DP/PT pulses bl, no calf tenderness bilaterally but Homan's sign present on the left.   Pulmonary/Chest: Effort normal and breath sounds normal.  Abdominal: Soft. She exhibits no distension.  Musculoskeletal: She exhibits tenderness. She exhibits no edema.  Mild swelling of the left knee anteriorly.  Well-healing surgical incisions with no signs of secondary skin infection.  No varus or valgus instability, negative anterior/posterior drawer test.  Calves are symmetrical in size bilaterally.  5/5 strength of BLE major muscle groups.  Neurological: She is alert and oriented to person, place, and time. No sensory deficit. She exhibits normal muscle tone.  Fluent speech, no facial droop, sensation intact to soft touch of bilateral lower extremities  Skin: Skin is warm and dry. No erythema.  Psychiatric: She has a normal mood and affect. Her behavior is normal.  Nursing note and vitals reviewed.    ED Treatments / Results  Labs (all labs ordered are listed, but only abnormal results are displayed) Labs Reviewed - No data to display  EKG  EKG Interpretation None       Radiology No results found.  Procedures Procedures (including critical care time)  Medications Ordered in ED Medications  Rivaroxaban (XARELTO) tablet 15 mg (15 mg Oral Given 03/07/17 1856)  rivaroxaban (XARELTO) tablet 20 mg (not administered)  HYDROcodone-acetaminophen (NORCO/VICODIN) 5-325 MG per tablet 1 tablet (1 tablet Oral Given  03/07/17 1806)     Initial Impression / Assessment and Plan / ED Course  I have reviewed the triage vital signs and the nursing notes.  Pertinent labs & imaging results that were available during my care of the patient were reviewed by me and considered in my medical decision making (see chart for details).     Patient 1 week status post knee surgery with positive DVT.  Preliminary read notes positive mobile acute DVT involving the left tibioperoneal trunk.  Also DVT noted in the left posterior tibial and peroneal veins.  No evidence of Baker's cyst.  Patient is afebrile, vital signs are stable.  She is neurovascularly intact.  No shortness of breath and lungs are clear to auscultation bilaterally.  Low suspicion of PE in the absence of hypoxia, tachycardia, or shortness of breath. Doubt fracture. Pharmacy was consulted and we will initiate  Xarelto in the ED. Advised to increase rest given mobile DVT.  She will be discharged with follow-up with Dr. Thomasena Edisollins and her PCP for reevaluation.  Discussed strict ED return precautions.  Pain has been managed in the ED.  Patient has no complaints prior to discharge.  Patient and patient's friend verbalized understanding of and agreement with plan and patient is stable for discharge at this time.  Discussed with Dr. Rosalia Hammersay who agrees with assessment and plan at this time.  Final Clinical Impressions(s) / ED Diagnoses   Final diagnoses:  Acute deep vein thrombosis (DVT) of distal vein of left lower extremity [facility](HCC)    ED Discharge Orders        Ordered    Rivaroxaban 15 & 20 MG TBPK     03/07/17 1841       Jeanie SewerFawze, Afrika Brick A, PA-C 03/07/17 2042    Margarita Grizzleay, Danielle, MD 03/11/17 [phone]

## 2017-03-07 NOTE — Discharge Instructions (Signed)
Take your Xarelto as prescribed. Stop taking your birth control as this is a risk factor for developing DVTs. Use backup contraception until your PCP or OBGYN starts you on another form of birth control. Decrease activity as tolerated. Keep the knee elevated. You may apply compression with ace bandage if it provides relief. Alternate ice and heat. Stop taking mobic and aspirin as these may enhance the anticoagulating effects of Xarelto.Take [phone] mg of Tylenol every 6 hours as needed for pain. Do not exceed 4000 mg of Tylenol daily.  Call your orthopedist to set up follow-up appointment.  Also call your primary care physician for reevaluation.  Return to the emergency department if any concerning signs or symptoms develop such as fever, severe headache, slurred speech or altered mental status, blurred vision, chest pain, or shortness of breath.

## 2017-03-07 NOTE — Care Management Note (Signed)
Case Management Note  Patient Details  Name: Laura Glass MRN: [id number] Date of Birth: [birth year]  Subjective/Objective:                  POSITIVE left calf DVT  [facility] Patient provided with a Xarelto 30 day free card. Patient denies any other CM needs.   Expected Discharge Date:     03/07/17             Expected Discharge Plan:  Home/Self Care  In-House Referral:     Discharge planning Services  CM Consult, Medication Assistance  Post Acute Care Choice:    Choice offered to:     DME Arranged:    DME Agency:     HH Arranged:    HH Agency:     Status of Service:  Completed, signed off  If discussed at [employer] Length of Stay Meetings, dates discussed:    Additional Comments:  Antony HasteBennett, Tedrick [place], RN 03/07/2017, 6:49 PM

## 2017-03-07 NOTE — Progress Notes (Signed)
ANTICOAGULATION CONSULT NOTE - Initial Consult  Pharmacy Consult for Xarelto Indication: DVT  No Known Allergies  Patient Measurements: Height: 5\' 4"  (162.6 cm) Weight: 155 lb (70.3 kg) IBW/kg (Calculated) : 54.7  Vital Signs: Temp: 98.3 F (36.8 C) (02/12 1739) Temp Source: Oral (02/12 1739) BP: 142/82 (02/12 1739) Pulse Rate: 71 (02/12 1739)  Labs: No results for input(s): HGB, HCT, PLT, APTT, LABPROT, INR, HEPARINUNFRC, HEPRLOWMOCWT, CREATININE, CKTOTAL, CKMB, TROPONINI in the last 72 hours.  CrCl cannot be calculated (No order found.).   Medical History: Past Medical History:  Diagnosis Date  . Allergy   . History of chicken pox     Assessment: 31yof s/p knee surgery on 2/5 now presents with left calf DVT. On birth control pta. She will begin xarelto.   Goal of Therapy:  Monitor platelets by anticoagulation protocol: Yes   Plan:  1) Xarelto 15mg  po bid x 21 days then 20mg  po daily 2) Case management consult ordered 3) Will need education  Fredrik RiggerMarkle, Londa Mackowski Sue 03/07/2017,6:23 PM

## 2017-03-07 NOTE — ED Triage Notes (Signed)
Pt had knee surgery on the 5th. Pt has had pain and redness in the L calf since.  Was seen today and had US done confirming DVT. VSS, A&Ox4.

## 2017-03-07 NOTE — Progress Notes (Signed)
*  Preliminary Results* Left lower extremity venous duplex completed. Left lower extremity is positive for mobile acute deep vein thrombosis involving the left tibioperoneal trunk. There is also acute deep vein thrombosis noted in the left posterior tibial and peroneal veins. There is no evidence of left Baker's cyst.  Preliminary results discussed with Dr. Rennis ChrisSupple.  03/07/2017 5:12 PM Gertie FeyMichelle Alvira Hecht, BS, RVT, RDCS, RDMS

## 2017-03-09 ENCOUNTER — Ambulatory Visit: Payer: BC Managed Care – PPO | Admitting: Internal Medicine

## 2017-03-09 ENCOUNTER — Encounter: Payer: Self-pay | Admitting: Internal Medicine

## 2017-03-09 VITALS — BP 122/80 | HR 79 | Temp 98.3°F | Wt 156.0 lb

## 2017-03-09 DIAGNOSIS — I82402 Acute embolism and thrombosis of unspecified deep veins of left lower extremity: Secondary | ICD-10-CM

## 2017-03-13 ENCOUNTER — Encounter: Payer: Self-pay | Admitting: Internal Medicine

## 2017-03-13 NOTE — Progress Notes (Signed)
Subjective:    Patient ID: Laura Glass, female    DOB: 1985/07/15, 32 y.o.   MRN: [id number]  HPI  Pt presents to the clinic today for ER follow up. She had surgery for patellofemoral surgery of the left knee 01/2017. Subsequently, she developed left leg swelling and pain. She followed up with her orthopedist, who ordered a venous doppler of the LLE, which was positive for DVT. She was sent to the ED for initiation of treatment. She was started on Xarelto. She denies any adverse effects of this medication. She is currently unable to do PT, because of the blood clot. She reports some swelling and pain, worse with ambulation.  Review of Systems      Past Medical History:  Diagnosis Date  . Allergy   . History of chicken pox     Current Outpatient Medications  Medication Sig Dispense Refill  . acetaminophen (TYLENOL) 325 MG tablet Take 325-650 mg by mouth every 6 (six) hours as needed (for pain).    Marland Kitchen. aspirin EC 81 MG tablet Take 81 mg by mouth daily.    . mometasone (NASONEX) 50 MCG/ACT nasal spray Place 2 sprays into the nose daily. (Patient taking differently: Place 2 sprays into the nose daily as needed (for seasonal allergies). ) 17 g 1  . olopatadine (PATANOL) 0.1 % ophthalmic solution Place 1 drop into both eyes 2 (two) times daily. (Patient taking differently: Place 1 drop into both eyes daily as needed for allergies. ) 5 mL 1  . Rivaroxaban 15 & 20 MG TBPK Take as directed on package: Start with one 15mg  tablet by mouth twice a day with food. On Day 22, switch to one 20mg  tablet once a day with food. 51 each 0   No current facility-administered medications for this visit.     No Known Allergies  Family History  Problem Relation Age of Onset  . Diabetes Mother   . Hypertension Mother   . Hypertension Father     Social History   Socioeconomic History  . Marital status: Single    Spouse name: Not on file  . Number of children: Not on file  . Years of education: Not  on file  . Highest education level: Not on file  Social Needs  . Financial resource strain: Not on file  . Food insecurity - worry: Not on file  . Food insecurity - inability: Not on file  . Transportation needs - medical: Not on file  . Transportation needs - non-medical: Not on file  Occupational History  . Not on file  Tobacco Use  . Smoking status: Never Smoker  . Smokeless tobacco: Never Used  Substance and Sexual Activity  . Alcohol use: Yes    Comment: occasional  . Drug use: No  . Sexual activity: Yes    Birth control/protection: Pill  Other Topics Concern  . Not on file  Social History Narrative  . Not on file     Constitutional: Denies fever, malaise, fatigue, headache or abrupt weight changes.  Cardiovascular: Pt reports left leg swelling. Denies chest pain, chest tightness, palpitations or swelling in the hands.  Musculoskeletal: Pt reports left leg pain. Denies decrease in range of motion, difficulty with gait, muscle pain or joint swelling.  Skin: Denies redness, rashes, lesions or ulcercations.   No other specific complaints in a complete review of systems (except as listed in HPI above).  Objective:   Physical Exam   BP 122/80   Pulse 79  Temp 98.3 F (36.8 C) (Oral)   Wt 156 lb (70.8 kg)   LMP 03/08/2017   SpO2 98%   BMI 26.78 kg/m  Wt Readings from Last 3 Encounters:  03/09/17 156 lb (70.8 kg)  03/07/17 155 lb (70.3 kg)  05/19/16 155 lb 12.8 oz (70.7 kg)    General: Appears her stated age, well developed, well nourished in NAD. Skin: Warm, dry and intact. No redness or warmth noted of the LLE. Musculoskeletal: Decreased flexion of the left knee. Normal extension. Negative Homan's. Gait slow but steady.   BMET No results found for: NA, K, CL, CO2, GLUCOSE, BUN, CREATININE, CALCIUM, GFRNONAA, GFRAA  Lipid Panel  No results found for: CHOL, TRIG, HDL, CHOLHDL, VLDL, LDLCALC  CBC    Component Value Date/Time   WBC 10.3 (A) 07/20/2011  1628   RBC 4.65 07/20/2011 1628   HGB 13.5 07/20/2011 1628   HCT 41.8 07/20/2011 1628   MCV 89.9 07/20/2011 1628   MCH 29.0 07/20/2011 1628   MCHC 32.3 07/20/2011 1628    Hgb A1C No results found for: HGBA1C         Assessment & Plan:   ER Follow Up for DVT, LLE:  ER notes, labs and imaging reviewed. Continue Xarelto as prescribed for 3 months Does not need hematology referral at this time Encouraged elevation, avoid strenuous activity She will follow up at ortho as scheduled, no vigorous PT until cleared by orthoo  Return precautions discussed Nicki Reaper, NP

## 2017-03-13 NOTE — Patient Instructions (Signed)
Deep Vein Thrombosis Deep vein thrombosis (DVT) is a condition in which a blood clot forms in a deep vein, such as a lower leg, thigh, or arm vein. A clot is blood that has thickened into a gel or solid. This condition is dangerous. It can lead to serious and even life-threatening complications if the clot travels to the lungs and causes a blockage (pulmonary embolism). It can also damage veins in the leg. This can result in leg pain, swelling, discoloration, and sores (post-thrombotic syndrome). What are the causes? This condition may be caused by:  A slowdown of blood flow.  Damage to a vein.  A condition that makes blood clot more easily.  What increases the risk? The following factors may make you more likely to develop this condition:  Being overweight.  Being elderly, especially over age 60.  Sitting or lying down for more than four hours.  Lack of physical activity (sedentary lifestyle).  Being pregnant, giving birth, or having recently given birth.  Taking medicines that contain estrogen.  Smoking.  A history of any of the following: ? Blood clots or blood clotting disease. ? Peripheral vascular disease. ? Inflammatory bowel disease. ? Cancer. ? Heart disease. ? Genetic conditions that affect how blood clots. ? Neurological diseases that affect the legs (leg paresis). ? Injury. ? Major or lengthy surgery. ? A central line placed inside a large vein.  What are the signs or symptoms? Symptoms of this condition include:  Swelling, pain, or tenderness in an arm or leg.  Warmth, redness, or discoloration in an arm or leg.  If the clot is in your leg, symptoms may be more noticeable or worse when you stand or walk. Some people do not have any symptoms. How is this diagnosed? This condition is diagnosed with:  A medical history.  A physical exam.  Tests, such as: ? Blood tests. These are done to see how your blood clots. ? Imaging tests. These are done to  check for clots. Tests may include:  Ultrasound.  CT scan.  MRI.  X-ray.  Venogram. For this test, X-rays are taken after a dye is injected into a vein.  How is this treated? Treatment for this condition depends on the cause, your risk for bleeding or developing more clots, and any medical conditions you have. Treatment may include:  Taking blood thinners (also called anticoagulants). These medicines may be taken by mouth, injected under the skin, or injected through an IV tube (catheter). These medicines prevent clots from forming.  Injecting medicine that dissolves blood clots into the affected vein (catheter-directed thrombolysis).  Having surgery. Surgery may be done to: ? Remove the clot. ? Place a filter in a large vein to catch blood clots before they reach the lungs.  Some treatments may be continued for up to six months. Follow these instructions at home: If you are taking an oral blood thinner:  Take the medicine exactly as told by your health care provider. Some blood thinners need to be taken at the same time every day. Do not skip a dose.  Ask your health care provider about what foods and drugs interact with the medicine.  Ask about possible side effects. General instructions  Blood thinners can cause easy bruising and difficulty stopping bleeding. Because of this, if you are taking or were given a blood thinner: ? Hold pressure over cuts for longer than usual. ? Tell your dentist and other health care providers that you are taking blood thinners before   having any procedures that can cause bleeding. ? Avoid contact sports.  Take over-the-counter and prescription medicines only as told by your health care provider.  Return to your normal activities as told by your health care provider. Ask your health care provider what activities are safe for you.  Wear compression stockings if recommended by your health care provider.  Keep all follow-up visits as told by  your health care provider. This is important. How is this prevented? To lower your risk of developing this condition again:  For 30 or more minutes every day, do an activity that: ? Involves moving your arms and legs. ? Increases your heart rate.  When traveling for longer than four hours: ? Exercise your arms and legs every hour. ? Drink plenty of water. ? Avoid drinking alcohol.  Avoid sitting or lying for a long time without moving your legs.  Stay a healthy weight.  If you are a woman who is older than age 35, avoid unnecessary use of medicines that contain estrogen.  Do not use any products that contain nicotine or tobacco, such as cigarettes and e-cigarettes. This is especially important if you take estrogen medicines. If you need help quitting, ask your health care provider.  Contact a health care provider if:  You miss a dose of your blood thinner.  You have nausea, vomiting, or diarrhea that lasts for more than one day.  Your menstrual period is heavier than usual.  You have unusual bruising. Get help right away if:  You have new or increased pain, swelling, or redness in an arm or leg.  You have numbness or tingling in an arm or leg.  You have shortness of breath.  You have chest pain.  You have a rapid or irregular heartbeat.  You feel light-headed or dizzy.  You cough up blood.  There is blood in your vomit, stool, or urine.  You have a serious fall or accident, or you hit your head.  You have a severe headache or confusion.  You have a cut that will not stop bleeding. These symptoms may represent a serious problem that is an emergency. Do not wait to see if the symptoms will go away. Get medical help right away. Call your local emergency services (911 in the [place] Do not drive yourself to the hospital. Summary  DVT is a condition in which a blood clot forms in a deep vein, such as a lower leg, thigh, or arm vein.  Symptoms can include swelling,  warmth, pain, and redness in your leg or arm.  Treatment may include taking blood thinners, injecting medicine that dissolves blood clots,wearing compression stockings, or surgery.  If you are prescribed blood thinners, take them exactly as told. This information is not intended to replace advice given to you by your health care provider. Make sure you discuss any questions you have with your health care provider. Document Released: 01/10/2005 Document Revised: 02/13/2016 Document Reviewed: 02/13/2016 Elsevier Interactive Patient Education  2018 [employer]  

## 2017-03-24 ENCOUNTER — Encounter: Payer: Self-pay | Admitting: Internal Medicine

## 2017-03-24 ENCOUNTER — Ambulatory Visit: Payer: BC Managed Care – PPO | Admitting: Internal Medicine

## 2017-03-24 VITALS — BP 124/80 | HR 77 | Temp 98.1°F | Wt 157.0 lb

## 2017-03-24 DIAGNOSIS — R202 Paresthesia of skin: Secondary | ICD-10-CM

## 2017-03-24 LAB — COMPREHENSIVE METABOLIC PANEL
ALBUMIN: 4.2 g/dL (ref 3.5–5.2)
ALT: 15 U/L (ref 0–35)
AST: 15 U/L (ref 0–37)
Alkaline Phosphatase: 49 U/L (ref 39–117)
BUN: 27 mg/dL — AB (ref 6–23)
CHLORIDE: 103 meq/L (ref 96–112)
CO2: 29 meq/L (ref 19–32)
CREATININE: 0.88 mg/dL (ref 0.40–1.20)
Calcium: 9.8 mg/dL (ref 8.4–10.5)
GFR: 80.56 mL/min (ref >60.00)
Glucose, Bld: 82 mg/dL (ref 70–99)
POTASSIUM: 4 meq/L (ref 3.5–5.1)
SODIUM: 139 meq/L (ref 135–145)
Total Bilirubin: 0.3 mg/dL (ref 0.2–1.2)
Total Protein: 7.3 g/dL (ref 6.0–8.3)

## 2017-03-24 LAB — CBC
HCT: 40.6 % (ref 36.0–46.0)
Hemoglobin: 13.7 g/dL (ref 12.0–15.0)
MCHC: 33.8 g/dL (ref 30.0–36.0)
MCV: 89 fl (ref 78.0–100.0)
Platelets: 261 10*3/uL (ref 150.0–400.0)
RBC: 4.57 Mil/uL (ref 3.87–5.11)
RDW: 12.2 % (ref 11.5–15.5)
WBC: 10.8 10*3/uL — ABNORMAL HIGH (ref 4.0–10.5)

## 2017-03-24 LAB — VITAMIN B12: Vitamin B-12: 543 pg/mL (ref 211–911)

## 2017-03-24 LAB — VITAMIN D 25 HYDROXY (VIT D DEFICIENCY, FRACTURES): VITD: 29.51 ng/mL — ABNORMAL LOW (ref 30.00–100.00)

## 2017-03-24 LAB — TSH: TSH: 2.05 u[IU]/mL (ref 0.35–4.50)

## 2017-03-24 NOTE — Patient Instructions (Signed)
Paresthesia Paresthesia is a burning or prickling feeling. This feeling can happen in any part of the body. It often happens in the hands, arms, legs, or feet. Usually, it is not painful. In most cases, the feeling goes away in a short time and is not a sign of a serious problem. Follow these instructions at home:  Avoid drinking alcohol.  Try massage or needle therapy (acupuncture) to help with your problems.  Keep all follow-up visits as told by your doctor. This is important. Contact a doctor if:  You keep on having episodes of paresthesia.  Your burning or prickling feeling gets worse when you walk.  You have pain or cramps.  You feel dizzy.  You have a rash. Get help right away if:  You feel weak.  You have trouble walking or moving.  You have problems speaking, understanding, or seeing.  You feel confused.  You cannot control when you pee (urinate) or poop (bowel movement).  You lose feeling (numbness) after an injury.  You pass out (faint). This information is not intended to replace advice given to you by your health care provider. Make sure you discuss any questions you have with your health care provider. Document Released: 12/24/2007 Document Revised: 06/18/2015 Document Reviewed: 01/06/2014 Elsevier Interactive Patient Education  2018 [employer] Inc[employer]  

## 2017-03-24 NOTE — Progress Notes (Signed)
Subjective:    Patient ID: Laura RombergBrittany Glass, female    DOB: November 08, 1985, 32 y.o.   MRN: [id number]  HPI  Pt presents to the clinic today with c/o numbness and tingling of the left lower extremity. She reports this started about 1 week ago. It is intermittent. She reports she can really notice a difference in the sensation when she is shaving. She denies lower extremity weakness. She recently had arthroscopic surgery which was complicated by a post op DVT. She reports the pain and swelling from that has resolved, and this issue is new and separate. She reports she did flex her knee more than she should have when her dog was falling out of her arms. She is currently not doing an PT/OT secondary to the DVT. She has a follow up with ortho next week.  Review of Systems      Past Medical History:  Diagnosis Date  . Allergy   . History of chicken pox     Current Outpatient Medications  Medication Sig Dispense Refill  . acetaminophen (TYLENOL) 325 MG tablet Take 325-650 mg by mouth every 6 (six) hours as needed (for pain).    Marland Kitchen. aspirin EC 81 MG tablet Take 81 mg by mouth daily.    . mometasone (NASONEX) 50 MCG/ACT nasal spray Place 2 sprays into the nose daily. (Patient taking differently: Place 2 sprays into the nose daily as needed (for seasonal allergies). ) 17 g 1  . olopatadine (PATANOL) 0.1 % ophthalmic solution Place 1 drop into both eyes 2 (two) times daily. (Patient taking differently: Place 1 drop into both eyes daily as needed for allergies. ) 5 mL 1  . Rivaroxaban 15 & 20 MG TBPK Take as directed on package: Start with one 15mg  tablet by mouth twice a day with food. On Day 22, switch to one 20mg  tablet once a day with food. 51 each 0   No current facility-administered medications for this visit.     No Known Allergies  Family History  Problem Relation Age of Onset  . Diabetes Mother   . Hypertension Mother   . Hypertension Father     Social History   Socioeconomic History    . Marital status: Single    Spouse name: Not on file  . Number of children: Not on file  . Years of education: Not on file  . Highest education level: Not on file  Social Needs  . Financial resource strain: Not on file  . Food insecurity - worry: Not on file  . Food insecurity - inability: Not on file  . Transportation needs - medical: Not on file  . Transportation needs - non-medical: Not on file  Occupational History  . Not on file  Tobacco Use  . Smoking status: Never Smoker  . Smokeless tobacco: Never Used  Substance and Sexual Activity  . Alcohol use: Yes    Comment: occasional  . Drug use: No  . Sexual activity: Yes    Birth control/protection: Pill  Other Topics Concern  . Not on file  Social History Narrative  . Not on file     Constitutional: Denies fever, malaise, fatigue, headache or abrupt weight changes.  Musculoskeletal: Denies decrease in range of motion, difficulty with gait, muscle pain or joint pain and swelling.  Skin: Denies redness, rashes, lesions or ulcercations.  Neurological: Pt reports numbness and tingling of left lower extremity. Denies dizziness, difficulty with memory, difficulty with speech.    No other specific complaints  in a complete review of systems (except as listed in HPI above).  Objective:   Physical Exam  BP 124/80   Pulse 77   Temp 98.1 F (36.7 C) (Oral)   Wt 157 lb (71.2 kg)   LMP 03/08/2017   SpO2 99%   BMI 26.95 kg/m  Wt Readings from Last 3 Encounters:  03/24/17 157 lb (71.2 kg)  03/09/17 156 lb (70.8 kg)  03/07/17 155 lb (70.3 kg)    General: Appears her stated age, well developed, well nourished in NAD. Skin: Warm, dry and intact. Arthroscopic sites intact without redness.. Musculoskeletal: Decreased flexion and extension of the left knee. Left knee swelling noted. No pain with palpation of the left calf, negative Homan's. Gait slow but steady, she limps with normal gait. She has difficulty walking on heels  and toes. Neurological: Alert and oriented. Sensation intact to BLE. BMET No results found for: NA, K, CL, CO2, GLUCOSE, BUN, CREATININE, CALCIUM, GFRNONAA, GFRAA  Lipid Panel  No results found for: CHOL, TRIG, HDL, CHOLHDL, VLDL, LDLCALC  CBC    Component Value Date/Time   WBC 10.3 (A) 07/20/2011 1628   RBC 4.65 07/20/2011 1628   HGB 13.5 07/20/2011 1628   HCT 41.8 07/20/2011 1628   MCV 89.9 07/20/2011 1628   MCH 29.0 07/20/2011 1628   MCHC 32.3 07/20/2011 1628    Hgb A1C No results found for: HGBA1C          Assessment & Plan:   Paresthesia of LLE:  Will check CBC, CMET, TSH, Vit D and B12 Follow up with ortho as well  Return precautions discussed Nicki Reaper, NP

## 2017-03-27 ENCOUNTER — Encounter: Payer: Self-pay | Admitting: Internal Medicine

## 2017-03-27 MED ORDER — RIVAROXABAN 20 MG PO TABS: 20.0000 mg | ORAL_TABLET | Freq: Every day | ORAL | 1 refills | Status: DC

## 2017-03-30 LAB — HM PAP SMEAR: HM Pap smear: NORMAL

## 2017-03-31 ENCOUNTER — Encounter: Payer: Self-pay | Admitting: Internal Medicine

## 2017-05-15 ENCOUNTER — Encounter: Payer: Self-pay | Admitting: Internal Medicine

## 2017-06-16 ENCOUNTER — Other Ambulatory Visit: Payer: Self-pay | Admitting: Internal Medicine

## 2017-06-16 NOTE — Telephone Encounter (Signed)
Looks like pt started medication in Feb 2018 and pt reports you advised her she would only need to be on medication for 3 months.... Please advise

## 2017-06-18 NOTE — Telephone Encounter (Signed)
Call pt:  She should not need additional Xarelto. Has she been advised for ortho or cardiology that she would need this longer?

## 2017-06-21 NOTE — Telephone Encounter (Signed)
Left detailed msg on VM per HIPAA  

## 2018-03-30 ENCOUNTER — Encounter: Payer: Self-pay | Admitting: Internal Medicine

## 2018-03-30 ENCOUNTER — Ambulatory Visit: Payer: BC Managed Care – PPO | Admitting: Internal Medicine

## 2018-03-30 VITALS — BP 120/84 | HR 74 | Temp 98.0°F | Wt 154.0 lb

## 2018-03-30 DIAGNOSIS — G44201 Tension-type headache, unspecified, intractable: Secondary | ICD-10-CM

## 2018-03-30 DIAGNOSIS — F439 Reaction to severe stress, unspecified: Secondary | ICD-10-CM | POA: Diagnosis not present

## 2018-03-30 DIAGNOSIS — F419 Anxiety disorder, unspecified: Secondary | ICD-10-CM

## 2018-03-30 DIAGNOSIS — R195 Other fecal abnormalities: Secondary | ICD-10-CM

## 2018-03-30 DIAGNOSIS — F329 Major depressive disorder, single episode, unspecified: Secondary | ICD-10-CM

## 2018-03-30 DIAGNOSIS — R112 Nausea with vomiting, unspecified: Secondary | ICD-10-CM | POA: Diagnosis not present

## 2018-03-30 LAB — COMPREHENSIVE METABOLIC PANEL
ALBUMIN: 4.8 g/dL (ref 3.5–5.2)
ALT: 15 U/L (ref 0–35)
AST: 16 U/L (ref 0–37)
Alkaline Phosphatase: 51 U/L (ref 39–117)
BILIRUBIN TOTAL: 0.5 mg/dL (ref 0.2–1.2)
BUN: 12 mg/dL (ref 6–23)
CALCIUM: 9.7 mg/dL (ref 8.4–10.5)
CO2: 29 meq/L (ref 19–32)
Chloride: 104 mEq/L (ref 96–112)
Creatinine, Ser: 0.9 mg/dL (ref 0.40–1.20)
GFR: 73.35 mL/min (ref >60.00)
Glucose, Bld: 84 mg/dL (ref 70–99)
Potassium: 4.1 mEq/L (ref 3.5–5.1)
SODIUM: 139 meq/L (ref 135–145)
Total Protein: 7.5 g/dL (ref 6.0–8.3)

## 2018-03-30 LAB — CBC
HCT: 42.6 % (ref 36.0–46.0)
Hemoglobin: 14.5 g/dL (ref 12.0–15.0)
MCHC: 34.1 g/dL (ref 30.0–36.0)
MCV: 89.5 fl (ref 78.0–100.0)
PLATELETS: 218 10*3/uL (ref 150.0–400.0)
RBC: 4.76 Mil/uL (ref 3.87–5.11)
RDW: 12 % (ref 11.5–15.5)
WBC: 7.6 10*3/uL (ref 4.0–10.5)

## 2018-03-30 LAB — TSH: TSH: 1.82 u[IU]/mL (ref 0.35–4.50)

## 2018-03-30 MED ORDER — ONDANSETRON HCL 4 MG PO TABS: 4.0000 mg | ORAL_TABLET | Freq: Three times a day (TID) | ORAL | 0 refills | Status: DC | PRN

## 2018-03-30 NOTE — Addendum Note (Signed)
Addended by: Alvina Chou on: 03/30/2018 03:25 PM   Modules accepted: Orders

## 2018-03-30 NOTE — Progress Notes (Signed)
Subjective:    Patient ID: Laura Glass, female    DOB: 15-Feb-1985, 33 y.o.   MRN: [id number]  HPI  Pt presents to the clinic today with c/o headache, nausea, vomiting and diarrhea. She report this started 3 weeks ago. The headache is located generally. She describes the pain as achy and pressure. She reports associated nausea and vomiting. She denies dizziness, sensitivity to light or sound. She denies abdominal pain. She last vomited 2 days ago. She reports her stools are loose and watery. She denies blood in her vomit or stools. She has tried anti nausea medications with minimal relief. She has had sick contacts. She denies recent changes in diet or medications. She is feeling stress, anxious, depressed and overwhelmed.  Review of Systems      Past Medical History:  Diagnosis Date  . Allergy   . History of chicken pox     Current Outpatient Medications  Medication Sig Dispense Refill  . acetaminophen (TYLENOL) 325 MG tablet Take 325-650 mg by mouth every 6 (six) hours as needed (for pain).    Marland Kitchen aspirin EC 81 MG tablet Take 81 mg by mouth daily.    . Levonorgestrel (KYLEENA) 19.5 MG IUD by Intrauterine route. 03/2017    . mometasone (NASONEX) 50 MCG/ACT nasal spray Place 2 sprays into the nose daily. (Patient taking differently: Place 2 sprays into the nose daily as needed (for seasonal allergies). ) 17 g 1  . olopatadine (PATANOL) 0.1 % ophthalmic solution Place 1 drop into both eyes 2 (two) times daily. (Patient taking differently: Place 1 drop into both eyes daily as needed for allergies. ) 5 mL 1   No current facility-administered medications for this visit.     No Known Allergies  Family History  Problem Relation Age of Onset  . Diabetes Mother   . Hypertension Mother   . Hypertension Father     Social History   Socioeconomic History  . Marital status: Single    Spouse name: Not on file  . Number of children: Not on file  . Years of education: Not on file  .  Highest education level: Not on file  Occupational History  . Not on file  Social Needs  . Financial resource strain: Not on file  . Food insecurity:    Worry: Not on file    Inability: Not on file  . Transportation needs:    Medical: Not on file    Non-medical: Not on file  Tobacco Use  . Smoking status: Never Smoker  . Smokeless tobacco: Never Used  Substance and Sexual Activity  . Alcohol use: Yes    Comment: occasional  . Drug use: No  . Sexual activity: Yes    Birth control/protection: Pill  Lifestyle  . Physical activity:    Days per week: Not on file    Minutes per session: Not on file  . Stress: Not on file  Relationships  . Social connections:    Talks on phone: Not on file    Gets together: Not on file    Attends religious service: Not on file    Active member of club or organization: Not on file    Attends meetings of clubs or organizations: Not on file    Relationship status: Not on file  . Intimate partner violence:    Fear of current or ex partner: Not on file    Emotionally abused: Not on file    Physically abused: Not on file  Forced sexual activity: Not on file  Other Topics Concern  . Not on file  Social History Narrative  . Not on file     Constitutional: Pt reports headache. Denies fever, malaise, fatigue, or abrupt weight changes.  HEENT: Denies eye pain, eye redness, ear pain, ringing in the ears, wax buildup, runny nose, nasal congestion, bloody nose, or sore throat. Respiratory: Denies difficulty breathing, shortness of breath, cough or sputum production.   Cardiovascular: Denies chest pain, chest tightness, palpitations or swelling in the hands or feet.  Gastrointestinal: Pt reports nausea, vomiting and diarrhea. Denies abdominal pain, bloating, constipation,  or blood in the stool.  GU: Denies urgency, frequency, pain with urination, burning sensation, blood in urine, odor or discharge. Neurological: Denies dizziness, difficulty with  memory, difficulty with speech or problems with balance and coordination.  Psych: Pt reports stress, anxiety and depression. Denies SI/HI.  No other specific complaints in a complete review of systems (except as listed in HPI above).  Objective:   Physical Exam BP 120/84   Pulse 74   Temp 98 F (36.7 C) (Oral)   Wt 154 lb (69.9 kg)   LMP 04/25/2017   SpO2 98%   BMI 26.43 kg/m  Wt Readings from Last 3 Encounters:  03/30/18 154 lb (69.9 kg)  03/24/17 157 lb (71.2 kg)  03/09/17 156 lb (70.8 kg)    General: Appears her stated age, well developed, well nourished in NAD. Skin: Warm, dry and intact. No rashes noted. Neck:  Neck supple, trachea midline. No masses, lumps or thyromegaly present.  Cardiovascular: Normal rate and rhythm. S1,S2 noted.  No murmur, rubs or gallops noted. Pulmonary/Chest: Normal effort and positive vesicular breath sounds. No respiratory distress. No wheezes, rales or ronchi noted.  Abdomen: Soft and nontender. Hyperactive bowel sounds. No distention or masses noted.  Neurological: Alert and oriented.  Coordination normal.  Psychiatric: Mood and affect flat. Behavior is normal. Judgment and thought content normal.     BMET    Component Value Date/Time   NA 139 03/24/2017 1439   K 4.0 03/24/2017 1439   CL 103 03/24/2017 1439   CO2 29 03/24/2017 1439   GLUCOSE 82 03/24/2017 1439   BUN 27 (H) 03/24/2017 1439   CREATININE 0.88 03/24/2017 1439   CALCIUM 9.8 03/24/2017 1439    Lipid Panel  No results found for: CHOL, TRIG, HDL, CHOLHDL, VLDL, LDLCALC  CBC    Component Value Date/Time   WBC 10.8 (H) 03/24/2017 1439   RBC 4.57 03/24/2017 1439   HGB 13.7 03/24/2017 1439   HCT 40.6 03/24/2017 1439   PLT 261.0 03/24/2017 1439   MCV 89.0 03/24/2017 1439   MCV 89.9 07/20/2011 1628   MCH 29.0 07/20/2011 1628   MCHC 33.8 03/24/2017 1439   RDW 12.2 03/24/2017 1439    Hgb A1C No results found for: HGBA1C           Assessment & Plan:    Headache, Nausea, Vomiting, Loose Stools, Stress, Anxiety and Depression:  Exam benign Urine preg negative Will check CBC, CMET and TSH today RX for Zofran 4 mg TID prn Consider Sertraline 25 mg PO daily if labs normal Consider a referral for therapy, pending labs  Will follow up after labs, return precautions discussed Nicki Reaper, NP

## 2018-03-30 NOTE — Patient Instructions (Signed)
Nausea, Adult Nausea is feeling sick to your stomach or feeling that you are about to throw up (vomit). Feeling sick to your stomach is usually not serious, but it may be an early sign of a more serious medical problem. As you feel sicker to your stomach, you may throw up. If you throw up, or if you are not able to drink enough fluids, there is a risk that you may lose too much water in your body (get dehydrated). If you lose too much water in your body, you may:  Feel tired.  Feel thirsty.  Have a dry mouth.  Have cracked lips.  Go pee (urinate) less often. Older adults and people who have other diseases or a weak body defense system (immune system) have a higher risk of losing too much water in the body. The main goals of treating this condition are:  To relieve your nausea.  To ensure your nausea occurs less often.  To prevent throwing up and losing too much fluid. Follow these instructions at home: Watch your symptoms for any changes. Tell your doctor about them. Follow these instructions as told by your doctor. Eating and drinking      Take an ORS (oral rehydration solution). This is a drink that is sold at pharmacies and stores.  Drink clear fluids in small amounts as you are able. These include: ? Water. ? Ice chips. ? Fruit juice that has water added (diluted fruit juice). ? Low-calorie sports drinks.  Eat bland, easy-to-digest foods in small amounts as you are able, such as: ? Bananas. ? Applesauce. ? Rice. ? Low-fat (lean) meats. ? Toast. ? Crackers.  Avoid drinking fluids that have a lot of sugar or caffeine in them. This includes energy drinks, sports drinks, and soda.  Avoid alcohol.  Avoid spicy or fatty foods. General instructions  Take over-the-counter and prescription medicines only as told by your doctor.  Rest at home while you get better.  Drink enough fluid to keep your pee (urine) pale yellow.  Take slow and deep breaths when you feel  sick to your stomach.  Avoid food or things that have strong smells.  Wash your hands often with soap and water. If you cannot use soap and water, use hand sanitizer.  Make sure that all people in your home wash their hands well and often.  Keep all follow-up visits as told by your doctor. This is important. Contact a doctor if:  You feel sicker to your stomach.  You feel sick to your stomach for more than 2 days.  You throw up.  You are not able to drink fluids without throwing up.  You have new symptoms.  You have a fever.  You have a headache.  You have muscle cramps.  You have a rash.  You have pain while peeing.  You feel light-headed or dizzy. Get help right away if:  You have pain in your chest, neck, arm, or jaw.  You feel very weak or you pass out (faint).  You have throw up that is bright red or looks like coffee grounds.  You have bloody or black poop (stools) or poop that looks like tar.  You have a very bad headache, a stiff neck, or both.  You have very bad pain, cramping, or bloating in your belly (abdomen).  You have trouble breathing or you are breathing very quickly.  Your heart is beating very quickly.  Your skin feels cold and clammy.  You feel confused.    You have signs of losing too much water in your body, such as: ? Dark pee, very little pee, or no pee. ? Cracked lips. ? Dry mouth. ? Sunken eyes. ? Sleepiness. ? Weakness. These symptoms may be an emergency. Do not wait to see if the symptoms will go away. Get medical help right away. Call your local emergency services (911 in the [place] Do not drive yourself to the hospital. Summary  Nausea is feeling sick to your stomach or feeling that you are about to throw up (vomit).  If you throw up, or if you are not able to drink enough fluids, there is a risk that you may lose too much water in your body (get dehydrated).  Eat and drink what your doctor tells you. Take  over-the-counter and prescription medicines only as told by your doctor.  Contact a doctor right away if your symptoms get worse or you have new symptoms.  Keep all follow-up visits as told by your doctor. This is important. This information is not intended to replace advice given to you by your health care provider. Make sure you discuss any questions you have with your health care provider. Document Released: 12/30/2010 Document Revised: 06/20/2017 Document Reviewed: 06/20/2017 Elsevier Interactive Patient Education  2019 [employer]  

## 2018-04-02 ENCOUNTER — Encounter: Payer: Self-pay | Admitting: Internal Medicine

## 2018-04-02 MED ORDER — SERTRALINE HCL 25 MG PO TABS: 25.0000 mg | ORAL_TABLET | Freq: Every day | ORAL | 1 refills | Status: DC

## 2018-04-24 ENCOUNTER — Other Ambulatory Visit: Payer: Self-pay | Admitting: Internal Medicine

## 2018-04-24 NOTE — Telephone Encounter (Signed)
Waiting to see if pt will do a webex visit

## 2018-04-30 NOTE — Telephone Encounter (Signed)
Left message on voicemail for pt to schedule a [email or web address] video visit for follow up... msg also sent via mychart

## 2018-05-02 ENCOUNTER — Encounter: Payer: Self-pay | Admitting: Internal Medicine

## 2018-05-03 ENCOUNTER — Encounter: Payer: Self-pay | Admitting: Internal Medicine

## 2018-05-03 ENCOUNTER — Ambulatory Visit (INDEPENDENT_AMBULATORY_CARE_PROVIDER_SITE_OTHER): Payer: BC Managed Care – PPO | Admitting: Internal Medicine

## 2018-05-03 DIAGNOSIS — F419 Anxiety disorder, unspecified: Secondary | ICD-10-CM | POA: Diagnosis not present

## 2018-05-03 DIAGNOSIS — F329 Major depressive disorder, single episode, unspecified: Secondary | ICD-10-CM | POA: Diagnosis not present

## 2018-05-03 DIAGNOSIS — F32A Depression, unspecified: Secondary | ICD-10-CM | POA: Insufficient documentation

## 2018-05-03 MED ORDER — SERTRALINE HCL 50 MG PO TABS: 50.0000 mg | ORAL_TABLET | Freq: Every day | ORAL | 3 refills | Status: DC

## 2018-05-03 NOTE — Patient Instructions (Signed)
Depression Screening Depression screening is a tool that your health care provider can use to learn if you have symptoms of depression. Depression is a common condition with many symptoms that are also often found in other conditions. Depression is treatable, but it must first be diagnosed. You may not know that certain feelings, thoughts, and behaviors that you are having can be symptoms of depression. Taking a depression screening test can help you and your health care provider decide if you need more assessment, or if you should be referred to a mental health care provider. What are the screening tests?  You may have a physical exam to see if another condition is affecting your mental health. You may have a blood or urine sample taken during the physical exam.  You may be interviewed using a screening tool that was developed from research, such as one of these: ? Patient Health Questionnaire (PHQ). This is a set of either 2 or 9 questions. A health care provider who has been trained to score this screening test uses a guide to assess if your symptoms suggest that you may have depression. ? Hamilton Depression Rating Scale (HAM-D). This is a set of either 17 or 24 questions. You may be asked to take it again during or after your treatment, to see if your depression has gotten better. ? Beck Depression Inventory (BDI). This is a set of 21 multiple choice questions. Your health care provider scores your answers to assess:  Your level of depression, ranging from mild to severe.  Your response to treatment.  Your health care provider may talk with you about your daily activities, such as eating, sleeping, work, and recreation, and ask if you have had any changes in activity.  Your health care provider may ask you to see a mental health specialist, such as a psychiatrist or psychologist, for more evaluation. Who should be screened for depression?   All adults, including adults with a family history  of a mental health disorder.  Adolescents who are 12-18 years old.  People who are recovering from a myocardial infarction (MI).  Pregnant women, or women who have given birth.  People who have a long-term (chronic) illness.  Anyone who has been diagnosed with another type of a mental health disorder.  Anyone who has symptoms that could show depression. What do my results mean? Your health care provider will review the results of your depression screening, physical exam, and lab tests. Positive screens suggest that you may have depression. Screening is the first step in getting the care that you may need. It is up to you to get your screening results. Ask your health care provider, or the department that is doing your screening tests, when your results will be ready. Talk with your health care provider about your results and diagnosis. A diagnosis of depression is made using the Diagnostic and Statistical Manual of Mental Disorders (DSM-V). This is a book that lists the number and type of symptoms that must be present for a health care provider to give a specific diagnosis.  Your health care provider may work with you to treat your symptoms of depression, or your health care provider may help you find a mental health provider who can assess, diagnose, and treat your depression. Get help right away if:  You have thoughts about hurting yourself or others. If you ever feel like you may hurt yourself or others, or have thoughts about taking your own life, get help right away. You   can go to your nearest emergency department or call:  Your local emergency services (911 in the [place]  A suicide crisis helpline, such as the National Suicide Prevention Lifeline at 1-800-273-8255. This is open 24 hours a day. Summary  Depression screening is the first step in getting the help that you may need.  If your screening test shows symptoms of depression (is positive), your health care provider may ask  you to see a mental health provider.  Anyone who is age 12 or older should be screened for depression. This information is not intended to replace advice given to you by your health care provider. Make sure you discuss any questions you have with your health care provider. Document Released: 05/27/2016 Document Revised: 05/27/2016 Document Reviewed: 05/27/2016 Elsevier Interactive Patient Education  2019 [employer] Inc.  

## 2018-05-03 NOTE — Progress Notes (Signed)
Virtual Visit via Video Note  I connected with Laura Glass on 05/03/18 at  9:15 AM EDT by a video enabled telemedicine application and verified that I am speaking with the correct person using two identifiers.   I discussed the limitations of evaluation and management by telemedicine and the availability of in person appointments. The patient expressed understanding and agreed to proceed.  History of Present Illness:  Pt due for follow up of anxiety and depression. She was started on Sertraline 03/2018. She has been taking the medication as prescribed and denies adverse side effects.   Observations/Objective:  Alert and oriented x 3 NAD Well kempt Behavior and thought content are normal  Assessment and Plan:  See problem based charting  Follow Up Instructions:    I discussed the assessment and treatment plan with the patient. The patient was provided an opportunity to ask questions and all were answered. The patient agreed with the plan and demonstrated an understanding of the instructions.   The patient was advised to call back or seek an in-person evaluation if the symptoms worsen or if the condition fails to improve as anticipated.     Nicki Reaper, NP

## 2018-05-03 NOTE — Assessment & Plan Note (Signed)
Improved but still not at goal Support offered today Will increase Sertraline to 50 mg daily, RX sent to pharmacy Advised her to update me in 3 weeks and let me know how she is doing

## 2018-05-25 ENCOUNTER — Other Ambulatory Visit: Payer: Self-pay | Admitting: Internal Medicine

## 2018-05-28 NOTE — Telephone Encounter (Signed)
Does she want to go up to 100?

## 2018-05-29 NOTE — Telephone Encounter (Signed)
Increasing the dose will likely only make those side effects worse. At this point, would recommending stopping sertraline and trying something else. Would recommend Lexapro 10 mg daily #30, 2 refills. If same results, not helping etc after 3 weeks, she should let me know. Less side effects.

## 2018-05-30 MED ORDER — ESCITALOPRAM OXALATE 10 MG PO TABS: 10.0000 mg | ORAL_TABLET | Freq: Every day | ORAL | 2 refills | Status: DC

## 2018-05-30 NOTE — Addendum Note (Signed)
Addended by: Roena Malady on: 05/30/2018 08:41 AM   Modules accepted: Orders

## 2018-06-21 ENCOUNTER — Other Ambulatory Visit: Payer: Self-pay | Admitting: Internal Medicine

## 2018-10-25 ENCOUNTER — Encounter: Payer: Self-pay | Admitting: Internal Medicine

## 2018-10-29 ENCOUNTER — Other Ambulatory Visit: Payer: Self-pay

## 2018-10-29 ENCOUNTER — Encounter: Payer: Self-pay | Admitting: Internal Medicine

## 2018-10-29 ENCOUNTER — Ambulatory Visit: Payer: BC Managed Care – PPO | Admitting: Internal Medicine

## 2018-10-29 VITALS — BP 120/80 | HR 82 | Temp 98.1°F | Wt 162.0 lb

## 2018-10-29 DIAGNOSIS — M7662 Achilles tendinitis, left leg: Secondary | ICD-10-CM

## 2018-10-29 MED ORDER — PREDNISONE 10 MG PO TABS: ORAL_TABLET | ORAL | 0 refills | Status: DC

## 2018-10-29 NOTE — Progress Notes (Signed)
Subjective:    Patient ID: Laura Glass, female    DOB: July 03, 1985, 33 y.o.   MRN: [id number]  HPI  Pt presents to the clinic today with c/o left heel pain. This started 3 weeks ago. She describes the pain as sharp and burning. The pain is worse with ambulation. She denies numbness, tingling or weakness.The area is tender to touch.  She denies redness or swelling over the area. Xray of left heel from 07/2014 did not show any acute findings. She has seen Dr. Margaretha Sheffield in the past for the same, diagnosed with calcified Achilles tendonitis. She was treated with injections, heel lifts and a brace. She reports she has been doing exercises she remembers from PT, Ibuprofen, Voltaren Gel,  and been using the brace, inserts and ice with minimal relief.   Review of Systems  Past Medical History:  Diagnosis Date  . Allergy   . History of chicken pox     Current Outpatient Medications  Medication Sig Dispense Refill  . escitalopram (LEXAPRO) 10 MG tablet TAKE 1 TABLET BY MOUTH EVERY DAY 90 tablet 0  . Levonorgestrel (KYLEENA) 19.5 MG IUD by Intrauterine route. 03/2017    . ondansetron (ZOFRAN) 4 MG tablet Take 1 tablet (4 mg total) by mouth every 8 (eight) hours as needed. 30 tablet 0   No current facility-administered medications for this visit.     No Known Allergies  Family History  Problem Relation Age of Onset  . Diabetes Mother   . Hypertension Mother   . Hypertension Father     Social History   Socioeconomic History  . Marital status: Single    Spouse name: Not on file  . Number of children: Not on file  . Years of education: Not on file  . Highest education level: Not on file  Occupational History  . Not on file  Social Needs  . Financial resource strain: Not on file  . Food insecurity    Worry: Not on file    Inability: Not on file  . Transportation needs    Medical: Not on file    Non-medical: Not on file  Tobacco Use  . Smoking status: Never Smoker  . Smokeless  tobacco: Never Used  Substance and Sexual Activity  . Alcohol use: Yes    Comment: occasional  . Drug use: No  . Sexual activity: Yes    Birth control/protection: Pill  Lifestyle  . Physical activity    Days per week: Not on file    Minutes per session: Not on file  . Stress: Not on file  Relationships  . Social Musician on phone: Not on file    Gets together: Not on file    Attends religious service: Not on file    Active member of club or organization: Not on file    Attends meetings of clubs or organizations: Not on file    Relationship status: Not on file  . Intimate partner violence    Fear of current or ex partner: Not on file    Emotionally abused: Not on file    Physically abused: Not on file    Forced sexual activity: Not on file  Other Topics Concern  . Not on file  Social History Narrative  . Not on file     Constitutional: Denies fever, malaise, fatigue, headache or abrupt weight changes.  Respiratory: Denies difficulty breathing, shortness of breath, cough or sputum production.   Cardiovascular: Denies chest pain,  chest tightness, palpitations or swelling in the hands or feet.  Musculoskeletal: Pt reports left heel pain, pain with ambulation. Denies decrease in range of motion, muscle pain or joint pain and swelling.  Skin: Denies redness, rashes, lesions or ulcercations.  Neurological: Denies numbness, tingling or problems with balance and coordination.    No other specific complaints in a complete review of systems (except as listed in HPI above).     Objective:   Physical Exam  BP 120/80   Pulse 82   Temp 98.1 F (36.7 C) (Temporal)   Wt 162 lb (73.5 kg)   SpO2 99%   BMI 27.81 kg/m  Wt Readings from Last 3 Encounters:  10/29/18 162 lb (73.5 kg)  03/30/18 154 lb (69.9 kg)  03/24/17 157 lb (71.2 kg)    General: Appears her stated age, well developed, well nourished in NAD. Skin: Warm, dry and intact. No redness or warmth noted of  left posterior heel. Cardiovascular: Normal rate and rhythm. S1,S2 noted.  No murmur, rubs or gallops noted.  Pulmonary/Chest: Normal effort and positive vesicular breath sounds. No respiratory distress. No wheezes, rales or ronchi noted.  Musculoskeletal: Normal plantarflexion. Pain with dorsiflexion. Pain with palpation over the posterior heel and along the Achilles tendon. Strength 5/5 BLE. No difficulty with gait. Neurological: Alert and oriented.   BMET    Component Value Date/Time   NA 139 03/30/2018 1515   K 4.1 03/30/2018 1515   CL 104 03/30/2018 1515   CO2 29 03/30/2018 1515   GLUCOSE 84 03/30/2018 1515   BUN 12 03/30/2018 1515   CREATININE 0.90 03/30/2018 1515   CALCIUM 9.7 03/30/2018 1515    Lipid Panel  No results found for: CHOL, TRIG, HDL, CHOLHDL, VLDL, LDLCALC  CBC    Component Value Date/Time   WBC 7.6 03/30/2018 1515   RBC 4.76 03/30/2018 1515   HGB 14.5 03/30/2018 1515   HCT 42.6 03/30/2018 1515   PLT 218.0 03/30/2018 1515   MCV 89.5 03/30/2018 1515   MCV 89.9 07/20/2011 1628   MCH 29.0 07/20/2011 1628   MCHC 34.1 03/30/2018 1515   RDW 12.0 03/30/2018 1515    Hgb A1C No results found for: HGBA1C       Assessment & Plan:   Left Achilles Tendonitis:  Ankle exercises given RX for Pred Taper x 9 days (avoid all other OTC NSAID'S) Referral placed to sports med- Dr. Micheline Chapman per pt request No indication for repeat xray at this time, will defer to sports med  Return precautions discussed Webb Silversmith, NP

## 2018-10-29 NOTE — Patient Instructions (Signed)
Achilles Tendinitis  Achilles tendinitis is inflammation of the tough, cord-like band that attaches the lower leg muscles to the heel bone (Achilles tendon). This is usually caused by overusing the tendon and the ankle joint. Achilles tendinitis usually gets better over time with treatment and caring for yourself at home. It can take weeks or months to heal completely. What are the causes? This condition may be caused by:  A sudden increase in exercise or activity, such as running.  Doing the same exercises or activities (such as jumping) over and over.  Not warming up calf muscles before exercising.  Exercising in shoes that are worn out or not made for exercise.  Having arthritis or a bone growth (spur) on the back of the heel bone. This can rub against the tendon and hurt it.  Age-related wear and tear. Tendons become less flexible with age and more likely to be injured. What are the signs or symptoms? Common symptoms of this condition include:  Pain in the Achilles tendon or in the back of the leg, just above the heel. The pain usually gets worse with exercise.  Stiffness or soreness in the back of the leg, especially in the morning.  Swelling of the skin over the Achilles tendon.  Thickening of the tendon.  Bone spurs at the bottom of the Achilles tendon, near the heel.  Trouble standing on tiptoe. How is this diagnosed? This condition is diagnosed based on your symptoms and a physical exam. You may have tests, including:  X-rays.  MRI. How is this treated? The goal of treatment is to relieve symptoms and help your injury heal. Treatment may include:  Decreasing or stopping activities that caused the tendinitis. This may mean switching to low-impact exercises like biking or swimming.  Icing the injured area.  Doing physical therapy, including strengthening and stretching exercises.  NSAIDs to help relieve pain and swelling.  Using supportive shoes, wraps, heel  lifts, or a walking boot (air cast).  Surgery. This may be done if your symptoms do not improve after 6 months.  Using high-energy shock wave impulses to stimulate the healing process (extracorporeal shock wave therapy). This is rare.  Injection of medicines to help relieve inflammation (corticosteroids). This is rare. Follow these instructions at home: If you have an air cast:  Wear the cast as told by your health care provider. Remove it only as told by your health care provider.  Loosen the cast if your toes tingle, become numb, or turn cold and blue. Activity  Gradually return to your normal activities once your health care provider approves. Do not do activities that cause pain. ? Consider doing low-impact exercises, like cycling or swimming.  If you have an air cast, ask your health care provider when it is safe for you to drive.  If physical therapy was prescribed, do exercises as told by your health care provider or physical therapist. Managing pain, stiffness, and swelling   Raise (elevate) your foot above the level of your heart while you are sitting or lying down.  Move your toes often to avoid stiffness and to lessen swelling.  If directed, put ice on the injured area: ? Put ice in a plastic bag. ? Place a towel between your skin and the bag. ? Leave the ice on for 20 minutes, 2-3 times a day General instructions  If directed, wrap your foot with an elastic bandage or other wrap. This can help keep your tendon from moving too much while it   heals. Your health care provider will show you how to wrap your foot correctly.  Wear supportive shoes or heel lifts only as told by your health care provider.  Take over-the-counter and prescription medicines only as told by your health care provider.  Keep all follow-up visits as told by your health care provider. This is important. Contact a health care provider if:  You have symptoms that gets worse.  You have pain that  does not get better with medicine.  You develop new, unexplained symptoms.  You develop warmth and swelling in your foot.  You have a fever. Get help right away if:  You have a sudden popping sound or sensation in your Achilles tendon followed by severe pain.  You cannot move your toes or foot.  You cannot put any weight on your foot. Summary  Achilles tendinitis is inflammation of the tough, cord-like band that attaches the lower leg muscles to the heel bone (Achilles tendon).  This condition is usually caused by overusing the tendon and the ankle joint. It can also be caused by arthritis or normal aging.  The most common symptoms of this condition include pain, swelling, or stiffness in the Achilles tendon or in the back of the leg.  This condition is usually treated with rest, NSAIDs, and physical therapy. This information is not intended to replace advice given to you by your health care provider. Make sure you discuss any questions you have with your health care provider. Document Released: 10/20/2004 Document Revised: 12/23/2016 Document Reviewed: 11/30/2015 Elsevier Patient Education  2020 [employer]  

## 2018-11-02 ENCOUNTER — Encounter: Payer: Self-pay | Admitting: Internal Medicine

## 2018-11-13 ENCOUNTER — Other Ambulatory Visit: Payer: Self-pay

## 2018-11-13 ENCOUNTER — Encounter: Payer: Self-pay | Admitting: Sports Medicine

## 2018-11-13 ENCOUNTER — Ambulatory Visit: Payer: BC Managed Care – PPO | Admitting: Sports Medicine

## 2018-11-13 VITALS — BP 108/70 | Ht 64.0 in | Wt 160.0 lb

## 2018-11-13 DIAGNOSIS — M7662 Achilles tendinitis, left leg: Secondary | ICD-10-CM | POA: Diagnosis not present

## 2018-11-14 ENCOUNTER — Encounter: Payer: Self-pay | Admitting: Sports Medicine

## 2018-11-14 NOTE — Progress Notes (Signed)
   Subjective:    Patient ID: Laura Glass, female    DOB: 1985-12-17, 33 y.o.   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  Pain has been present since September.  She denies any injury but rather describes a gradual onset of pain that is localized to the posterior heel.  She has a well-documented history of insertional Achilles tendinopathy.  Last seen in our office in 2016.  Ultrasound at that time showed significant calcification within the distal Achilles tendon.  She was eventually referred to Dr. Sharol Given and he injected the area with no improvement in symptoms.  Since then her symptoms have been intermittent.  She does notice her pain is worse with running.  She has recently been playing more softball.  She has tried icing which helps minimally.  She takes ibuprofen before playing which has not been helpful.  She denies pain elsewhere through the heel or foot.  Past surgical history reviewed.  Interim surgical history reveals a left knee arthroscopy for plica movement.  She developed postoperative DVT. Current medications reviewed Allergies reviewed  Review of Systems As above    Objective:   Physical Exam  Well-developed, well-nourished.  No acute distress.  Awake alert and oriented x3.  Vital signs reviewed  Left heel: Mild amount of swelling at the insertion of the Achilles tendon under the posterior calcaneus.  Mild tenderness to palpation.  No swelling of the mid substance of the Achilles.  Good strength.  Good plantar flexion with Thompson's testing.  No other bony or soft tissue tenderness to palpation around the foot or ankle.  Good pulses.  Walking without a significant limp.  Limited MSK ultrasound of the left calcaneus was obtained.  Once again seen is a large area of calcification in the distal Achilles tendon at the insertion onto the calcaneus.  There are hypoechoic changes  surrounding this spur.  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  She is instructed not to drop the heel below the level of the step.  We will also try simple donut cushion around her area of tenderness when she is playing softball.  Unfortunately this is a chronic problem for her.  She will return to the office in 4 weeks for reevaluation.  I may need to refer her to Dr. Lucia Gaskins for his input if she continues to struggle.  She will call with questions or concerns prior to her follow-up visit.

## 2018-12-11 ENCOUNTER — Ambulatory Visit: Payer: BC Managed Care – PPO | Admitting: Sports Medicine

## 2018-12-11 ENCOUNTER — Other Ambulatory Visit: Payer: Self-pay

## 2018-12-11 VITALS — BP 120/84 | Ht 64.0 in | Wt 160.0 lb

## 2018-12-11 DIAGNOSIS — M7662 Achilles tendinitis, left leg: Secondary | ICD-10-CM

## 2018-12-11 NOTE — Patient Instructions (Signed)
We have scheduled you to see Dr. Lucia Gaskins for your left achilles [place]  [phone]  Appt: 12/17/2018 @ 2:30 pm. Please arrive at 2:15 pm.  They will be mailing you new patient paperwork to fill out prior to your appt.

## 2018-12-11 NOTE — Progress Notes (Signed)
Patient ID: Laura Glass, female   DOB: May 23, 1985, 33 y.o.   MRN: [id number]  Laura Glass comes in today for follow-up on chronic left Achilles tendinopathy.  She's still in quite a bit of pain despite a home exercise program.  She is unable to run without pain.  Physical exam was not repeated today.  We simply talked about her ongoing chronic heel pain.  Laura Glass has had symptoms now for well over 2 years.  At this time I think consultation with Dr. Lucia Gaskins at [facility] is warranted.  Laura Glass is in agreement with this plan.  I am going to defer further work-up and treatment to the discretion of Dr. Lucia Gaskins and the patient will follow up with me as needed.

## 2018-12-18 ENCOUNTER — Other Ambulatory Visit: Payer: Self-pay

## 2018-12-18 ENCOUNTER — Encounter (HOSPITAL_BASED_OUTPATIENT_CLINIC_OR_DEPARTMENT_OTHER): Payer: Self-pay | Admitting: *Deleted

## 2018-12-18 ENCOUNTER — Other Ambulatory Visit: Payer: Self-pay | Admitting: Orthopaedic Surgery

## 2018-12-24 ENCOUNTER — Other Ambulatory Visit (HOSPITAL_COMMUNITY)
Admission: RE | Admit: 2018-12-24 | Discharge: 2018-12-24 | Disposition: A | Discharge status: Home / Self Care | Payer: BC Managed Care – PPO | Source: Ambulatory Visit | Attending: Orthopaedic Surgery | Admitting: Orthopaedic Surgery

## 2018-12-24 DIAGNOSIS — Z01812 Encounter for preprocedural laboratory examination: Secondary | ICD-10-CM | POA: Insufficient documentation

## 2018-12-24 DIAGNOSIS — Z20828 Contact with and (suspected) exposure to other viral communicable diseases: Secondary | ICD-10-CM | POA: Insufficient documentation

## 2018-12-25 LAB — NOVEL CORONAVIRUS, NAA (HOSP ORDER, SEND-OUT TO REF LAB; TAT 18-24 HRS): SARS-CoV-2, NAA: NOT DETECTED

## 2018-12-27 ENCOUNTER — Other Ambulatory Visit: Payer: Self-pay

## 2018-12-27 ENCOUNTER — Encounter (HOSPITAL_BASED_OUTPATIENT_CLINIC_OR_DEPARTMENT_OTHER)
Admission: RE | Disposition: A | Discharge status: Home / Self Care | Payer: Self-pay | Source: Home / Self Care | Attending: Orthopaedic Surgery

## 2018-12-27 ENCOUNTER — Ambulatory Visit (HOSPITAL_BASED_OUTPATIENT_CLINIC_OR_DEPARTMENT_OTHER)
Admission: RE | Admit: 2018-12-27 | Discharge: 2018-12-27 | Disposition: A | Discharge status: Home / Self Care | Payer: BC Managed Care – PPO | Attending: Orthopaedic Surgery | Admitting: Orthopaedic Surgery

## 2018-12-27 ENCOUNTER — Ambulatory Visit (HOSPITAL_BASED_OUTPATIENT_CLINIC_OR_DEPARTMENT_OTHER): Payer: BC Managed Care – PPO | Admitting: Certified Registered Nurse Anesthetist

## 2018-12-27 ENCOUNTER — Encounter (HOSPITAL_BASED_OUTPATIENT_CLINIC_OR_DEPARTMENT_OTHER): Payer: Self-pay

## 2018-12-27 DIAGNOSIS — M7662 Achilles tendinitis, left leg: Secondary | ICD-10-CM | POA: Diagnosis not present

## 2018-12-27 DIAGNOSIS — M899 Disorder of bone, unspecified: Secondary | ICD-10-CM | POA: Insufficient documentation

## 2018-12-27 DIAGNOSIS — Z975 Presence of (intrauterine) contraceptive device: Secondary | ICD-10-CM | POA: Insufficient documentation

## 2018-12-27 DIAGNOSIS — Z86718 Personal history of other venous thrombosis and embolism: Secondary | ICD-10-CM | POA: Diagnosis not present

## 2018-12-27 HISTORY — PX: ACHILLES TENDON SURGERY: SHX542

## 2018-12-27 HISTORY — PX: HAGLAND'S DEFORMITY EXCISION: SHX1718

## 2018-12-27 HISTORY — DX: Acute embolism and thrombosis of unspecified deep veins of unspecified lower extremity: I82.409

## 2018-12-27 HISTORY — DX: Achilles tendinitis, unspecified leg: M76.60

## 2018-12-27 LAB — POCT PREGNANCY, URINE: Preg Test, Ur: NEGATIVE

## 2018-12-27 SURGERY — REPAIR, TENDON, ACHILLES
Anesthesia: General | Site: Foot | Laterality: Left

## 2018-12-27 MED ORDER — PROPOFOL 10 MG/ML IV BOLUS
INTRAVENOUS | Status: DC | PRN
  Administered 2018-12-27: 130 mg via INTRAVENOUS

## 2018-12-27 MED ORDER — OXYCODONE HCL 5 MG PO TABS
5.0000 mg | ORAL_TABLET | Freq: Once | ORAL | Status: AC | PRN
  Administered 2018-12-27: 11:00:00 5 mg via ORAL

## 2018-12-27 MED ORDER — ONDANSETRON HCL 4 MG/2ML IJ SOLN
INTRAMUSCULAR | Status: AC
  Filled 2018-12-27: qty 2

## 2018-12-27 MED ORDER — MIDAZOLAM HCL 2 MG/2ML IJ SOLN
INTRAMUSCULAR | Status: AC
  Filled 2018-12-27: qty 2

## 2018-12-27 MED ORDER — FENTANYL CITRATE (PF) 100 MCG/2ML IJ SOLN
INTRAMUSCULAR | Status: AC
  Filled 2018-12-27: qty 2

## 2018-12-27 MED ORDER — POVIDONE-IODINE 10 % EX SWAB
2.0000 "application " | Freq: Once | CUTANEOUS | Status: AC
  Administered 2018-12-27: 2 via TOPICAL

## 2018-12-27 MED ORDER — ROCURONIUM BROMIDE 10 MG/ML (PF) SYRINGE
PREFILLED_SYRINGE | INTRAVENOUS | Status: AC
  Filled 2018-12-27: qty 10

## 2018-12-27 MED ORDER — PROPOFOL 10 MG/ML IV BOLUS
INTRAVENOUS | Status: AC
  Filled 2018-12-27: qty 20

## 2018-12-27 MED ORDER — LIDOCAINE HCL (CARDIAC) PF 100 MG/5ML IV SOSY
PREFILLED_SYRINGE | INTRAVENOUS | Status: DC | PRN
  Administered 2018-12-27: 100 mg via INTRAVENOUS

## 2018-12-27 MED ORDER — ONDANSETRON HCL 4 MG/2ML IJ SOLN
4.0000 mg | Freq: Once | INTRAMUSCULAR | Status: AC | PRN
  Administered 2018-12-27: 12:00:00 4 mg via INTRAVENOUS

## 2018-12-27 MED ORDER — ONDANSETRON HCL 4 MG/2ML IJ SOLN
INTRAMUSCULAR | Status: DC | PRN
  Administered 2018-12-27: 4 mg via INTRAVENOUS

## 2018-12-27 MED ORDER — BUPIVACAINE HCL (PF) 0.5 % IJ SOLN
INTRAMUSCULAR | Status: AC
  Filled 2018-12-27: qty 30

## 2018-12-27 MED ORDER — DEXAMETHASONE SODIUM PHOSPHATE 10 MG/ML IJ SOLN
INTRAMUSCULAR | Status: DC | PRN
  Administered 2018-12-27: 5 mg via INTRAVENOUS

## 2018-12-27 MED ORDER — OXYCODONE HCL 5 MG PO TABS: 5.0000 mg | ORAL_TABLET | ORAL | 0 refills | Status: AC | PRN

## 2018-12-27 MED ORDER — FENTANYL CITRATE (PF) 250 MCG/5ML IJ SOLN
INTRAMUSCULAR | Status: DC | PRN
  Administered 2018-12-27: 100 ug via INTRAVENOUS

## 2018-12-27 MED ORDER — MIDAZOLAM HCL 2 MG/2ML IJ SOLN
1.0000 mg | INTRAMUSCULAR | Status: DC | PRN
  Administered 2018-12-27: 08:00:00 2 mg via INTRAVENOUS

## 2018-12-27 MED ORDER — HYDROMORPHONE HCL 1 MG/ML IJ SOLN
INTRAMUSCULAR | Status: AC
  Filled 2018-12-27: qty 0.5

## 2018-12-27 MED ORDER — CEFAZOLIN SODIUM-DEXTROSE 2-4 GM/100ML-% IV SOLN
INTRAVENOUS | Status: AC
  Filled 2018-12-27: qty 100

## 2018-12-27 MED ORDER — BUPIVACAINE HCL (PF) 0.25 % IJ SOLN
INTRAMUSCULAR | Status: AC
  Filled 2018-12-27: qty 30

## 2018-12-27 MED ORDER — FENTANYL CITRATE (PF) 100 MCG/2ML IJ SOLN
50.0000 ug | INTRAMUSCULAR | Status: DC | PRN
  Administered 2018-12-27: 100 ug via INTRAVENOUS

## 2018-12-27 MED ORDER — HYDROMORPHONE HCL 1 MG/ML IJ SOLN
0.2500 mg | INTRAMUSCULAR | Status: DC | PRN
  Administered 2018-12-27 (×2): 0.5 mg via INTRAVENOUS

## 2018-12-27 MED ORDER — ROCURONIUM BROMIDE 10 MG/ML (PF) SYRINGE
PREFILLED_SYRINGE | INTRAVENOUS | Status: DC | PRN
  Administered 2018-12-27: 70 mg via INTRAVENOUS

## 2018-12-27 MED ORDER — BUPIVACAINE-EPINEPHRINE (PF) 0.5% -1:200000 IJ SOLN
INTRAMUSCULAR | Status: DC | PRN
  Administered 2018-12-27: 30 mL via PERINEURAL

## 2018-12-27 MED ORDER — MEPERIDINE HCL 25 MG/ML IJ SOLN: 6.2500 mg | INTRAMUSCULAR | Status: DC | PRN

## 2018-12-27 MED ORDER — CEFAZOLIN SODIUM-DEXTROSE 2-4 GM/100ML-% IV SOLN
2.0000 g | INTRAVENOUS | Status: AC
  Administered 2018-12-27: 2 g via INTRAVENOUS

## 2018-12-27 MED ORDER — OXYCODONE HCL 5 MG PO TABS
ORAL_TABLET | ORAL | Status: AC
  Filled 2018-12-27: qty 1

## 2018-12-27 MED ORDER — LACTATED RINGERS IV SOLN
INTRAVENOUS | Status: DC
  Administered 2018-12-27 (×2): via INTRAVENOUS

## 2018-12-27 MED ORDER — SUGAMMADEX SODIUM 200 MG/2ML IV SOLN
INTRAVENOUS | Status: DC | PRN
  Administered 2018-12-27: 150 mg via INTRAVENOUS

## 2018-12-27 MED ORDER — LIDOCAINE-EPINEPHRINE (PF) 1.5 %-1:200000 IJ SOLN
INTRAMUSCULAR | Status: DC | PRN
  Administered 2018-12-27: 30 mL via PERINEURAL

## 2018-12-27 MED ORDER — LIDOCAINE 2% (20 MG/ML) 5 ML SYRINGE
INTRAMUSCULAR | Status: AC
  Filled 2018-12-27: qty 5

## 2018-12-27 SURGICAL SUPPLY — 73 items
BANDAGE ESMARK 6X9 LF (GAUZE/BANDAGES/DRESSINGS) ×3 IMPLANT
BENZOIN TINCTURE PRP APPL 2/3 (GAUZE/BANDAGES/DRESSINGS) IMPLANT
BLADE AVERAGE 25MMX9MM (BLADE) ×1
BLADE AVERAGE 25X9 (BLADE) ×4 IMPLANT
BLADE MICRO SAGITTAL (BLADE) ×5 IMPLANT
BLADE SURG 15 STRL LF DISP TIS (BLADE) ×6 IMPLANT
BLADE SURG 15 STRL SS (BLADE) ×4
BNDG ELASTIC 6X5.8 VLCR STR LF (GAUZE/BANDAGES/DRESSINGS) IMPLANT
BNDG ESMARK 6X9 LF (GAUZE/BANDAGES/DRESSINGS) ×5
CHLORAPREP W/TINT 26 (MISCELLANEOUS) ×10 IMPLANT
CLOSURE WOUND 1/2 X4 (GAUZE/BANDAGES/DRESSINGS)
COVER BACK TABLE REUSABLE LG (DRAPES) ×5 IMPLANT
COVER MAYO STAND REUSABLE (DRAPES) ×5 IMPLANT
COVER WAND RF STERILE (DRAPES) IMPLANT
CUFF TOURN SGL QUICK 34 (TOURNIQUET CUFF) ×2
CUFF TRNQT CYL 34X4.125X (TOURNIQUET CUFF) ×3 IMPLANT
DECANTER SPIKE VIAL GLASS SM (MISCELLANEOUS) IMPLANT
DRAPE EXTREMITY T 121X128X90 (DISPOSABLE) ×5 IMPLANT
DRAPE HALF SHEET 70X43 (DRAPES) ×5 IMPLANT
DRAPE IMP U-DRAPE 54X76 (DRAPES) ×5 IMPLANT
DRAPE OEC MINIVIEW 54X84 (DRAPES) ×5 IMPLANT
DRAPE U-SHAPE 47X51 STRL (DRAPES) ×5 IMPLANT
DRSG PAD ABDOMINAL 8X10 ST (GAUZE/BANDAGES/DRESSINGS) ×5 IMPLANT
ELECT REM PT RETURN 9FT ADLT (ELECTROSURGICAL) ×5
ELECTRODE REM PT RTRN 9FT ADLT (ELECTROSURGICAL) ×3 IMPLANT
GAUZE SPONGE 4X4 12PLY STRL (GAUZE/BANDAGES/DRESSINGS) ×5 IMPLANT
GAUZE XEROFORM 1X8 LF (GAUZE/BANDAGES/DRESSINGS) ×5 IMPLANT
GLOVE BIOGEL M STRL SZ7.5 (GLOVE) ×10 IMPLANT
GLOVE BIOGEL PI IND STRL 7.5 (GLOVE) ×6 IMPLANT
GLOVE BIOGEL PI IND STRL 8 (GLOVE) ×3 IMPLANT
GLOVE BIOGEL PI INDICATOR 7.5 (GLOVE) ×4
GLOVE BIOGEL PI INDICATOR 8 (GLOVE) ×2
GOWN STRL REUS W/ TWL LRG LVL3 (GOWN DISPOSABLE) ×3 IMPLANT
GOWN STRL REUS W/ TWL XL LVL3 (GOWN DISPOSABLE) ×3 IMPLANT
GOWN STRL REUS W/TWL LRG LVL3 (GOWN DISPOSABLE) ×2
GOWN STRL REUS W/TWL XL LVL3 (GOWN DISPOSABLE) ×2
IMPL SYS BIOCOMP ACH SPEED (Anchor) ×3 IMPLANT
IMPLANT SYS BIOCOMP ACH SPEED (Anchor) ×5 IMPLANT
NDL SUT 6 .5 CRC .975X.05 MAYO (NEEDLE) ×3 IMPLANT
NEEDLE HYPO 22GX1.5 SAFETY (NEEDLE) IMPLANT
NEEDLE MAYO TAPER (NEEDLE) ×2
NS IRRIG 1000ML POUR BTL (IV SOLUTION) ×5 IMPLANT
PACK BASIN DAY SURGERY FS (CUSTOM PROCEDURE TRAY) ×5 IMPLANT
PAD CAST 4YDX4 CTTN HI CHSV (CAST SUPPLIES) ×3 IMPLANT
PADDING CAST COTTON 4X4 STRL (CAST SUPPLIES) ×2
PADDING CAST SYNTHETIC 4 (CAST SUPPLIES) ×8
PADDING CAST SYNTHETIC 4X4 STR (CAST SUPPLIES) ×12 IMPLANT
PENCIL SMOKE EVACUATOR (MISCELLANEOUS) ×5 IMPLANT
SCOTCHCAST PLUS 4X4 WHITE (CAST SUPPLIES) ×10 IMPLANT
SLEEVE SCD COMPRESS KNEE MED (MISCELLANEOUS) ×5 IMPLANT
SPONGE LAP 18X18 RF (DISPOSABLE) IMPLANT
STOCKINETTE 6  STRL (DRAPES) ×2
STOCKINETTE 6 STRL (DRAPES) ×3 IMPLANT
STOCKINETTE TUBULAR 6 INCH (GAUZE/BANDAGES/DRESSINGS) ×5 IMPLANT
STRIP CLOSURE SKIN 1/2X4 (GAUZE/BANDAGES/DRESSINGS) IMPLANT
SUCTION FRAZIER HANDLE 10FR (MISCELLANEOUS) ×2
SUCTION TUBE FRAZIER 10FR DISP (MISCELLANEOUS) ×3 IMPLANT
SURGILUBE 2OZ TUBE FLIPTOP (MISCELLANEOUS) IMPLANT
SUT ETHILON 3 0 PS 1 (SUTURE) ×5 IMPLANT
SUT FIBERWIRE #2 38 REV NDL BL (SUTURE)
SUT MNCRL AB 3-0 PS2 18 (SUTURE) ×5 IMPLANT
SUT PDS AB 2-0 CT2 27 (SUTURE) ×5 IMPLANT
SUT VIC AB 0 CT1 27 (SUTURE)
SUT VIC AB 0 CT1 27XBRD ANBCTR (SUTURE) IMPLANT
SUT VIC AB 0 SH 27 (SUTURE) IMPLANT
SUT VIC AB 2-0 SH 18 (SUTURE) IMPLANT
SUTURE FIBERWR#2 38 REV NDL BL (SUTURE) IMPLANT
SYR BULB 3OZ (MISCELLANEOUS) ×5 IMPLANT
SYR CONTROL 10ML LL (SYRINGE) IMPLANT
TOWEL GREEN STERILE FF (TOWEL DISPOSABLE) ×10 IMPLANT
TUBE CONNECTING 20'X1/4 (TUBING) ×1
TUBE CONNECTING 20X1/4 (TUBING) ×4 IMPLANT
UNDERPAD 30X36 HEAVY ABSORB (UNDERPADS AND DIAPERS) ×5 IMPLANT

## 2018-12-27 NOTE — H&P (Signed)
Laura Glass is an 33 y.o. female.   Chief Complaint: Left chronic Achilles tendinitis HPI: Laura Glass is here today for surgical correction of her Achilles tendon and calcaneal exostectomy.  She has longstanding Achilles tendinitis and is failed conservative treatment.  She had continued pain in the posterior aspect of the left heel.  She is tried boot immobilization, physical therapy, injections, anti-inflammatories and activity modification without relief.  She continues to have pain.  She has some swelling in the posterior aspect of her heel.  Denies any injury recently.  Denies any recent fevers or chills.  Past Medical History:  Diagnosis Date  . Achilles tendonitis   . Allergy   . DVT (deep venous thrombosis) (HCC)   . History of chicken pox     Past Surgical History:  Procedure Laterality Date  . KNEE SURGERY Left 02/28/2017  . WISDOM TOOTH EXTRACTION      Family History  Problem Relation Age of Onset  . Diabetes Mother   . Hypertension Mother   . Hypertension Father    Social History:  reports that she has never smoked. She has never used smokeless tobacco. She reports current alcohol use. She reports that she does not use drugs.  Allergies: No Known Allergies  Medications Prior to Admission  Medication Sig Dispense Refill  . Levonorgestrel (KYLEENA) 19.5 MG IUD by Intrauterine route. 03/2017      Results for orders placed or performed during the hospital encounter of 12/27/18 (from the past 48 hour(s))  Pregnancy, urine POC     Status: None   Collection Time: 12/27/18  7:05 AM  Result Value Ref Range   Preg Test, Ur NEGATIVE NEGATIVE    Comment:        THE SENSITIVITY OF THIS METHODOLOGY IS >24 mIU/mL    No results found.  Review of Systems  Constitutional: Negative.   HENT: Negative.   Eyes: Negative.   Respiratory: Negative.   Cardiovascular: Negative.   Gastrointestinal: Negative.   Musculoskeletal:       Left heel pain  Skin: Negative.   Neurological:  Negative.   Psychiatric/Behavioral: Negative.     Blood pressure (!) 134/91, pulse 77, temperature (!) 96.8 F (36 C), temperature source Oral, resp. rate 20, height 5\' 4"  (1.626 m), weight 74.3 kg, last menstrual period 11/27/2018. Physical Exam  Constitutional: She appears well-developed.  HENT:  Head: Normocephalic.  Eyes: Conjunctivae are normal.  Neck: Neck supple.  Cardiovascular: Normal rate.  Respiratory: Effort normal.  GI: Soft.  Musculoskeletal:     Comments: Left heel demonstrates bony prominence posteriorly about the calcaneal tuberosity.  She has tenderness to palpation at the insertion of the Achilles tendon.  No tenderness proximally about the substance of the tendon.  No obvious tendon thickening proximally.  Good plantarflexion strength.  No defect.  Sensation intact in the dorsal plantar foot.  Foot is warm and well-perfused.  Neurological: She is alert.  Skin: Skin is warm.  Psychiatric: She has a normal mood and affect.     Assessment/Plan We will plan for Achilles debridement and reconstruction with calcaneal exostectomy.  Possible flexor pollicis longus tendon transfer if needed.  She understands the risk benefits alternatives of surgery which include but not limited to wound healing complications, infection, continued pain, need for further surgery, weakness and damaging surrounding structures.  She also understands the perioperative and anesthetic risk which include death.  She understands the postoperative weightbearing restrictions and agrees to comply.  We will proceed with surgery.  Erle Crocker, MD 12/27/2018, 8:01 AM

## 2018-12-27 NOTE — Anesthesia Procedure Notes (Signed)
Anesthesia Regional Block: Popliteal block   Pre-Anesthetic Checklist: ,, timeout performed, Correct Patient, Correct Site, Correct Laterality, Correct Procedure, Correct Position, site marked, Risks and benefits discussed,  Surgical consent,  Pre-op evaluation,  At surgeon's request and post-op pain management  Laterality: Left  Prep: chloraprep       Needles:  Injection technique: Single-shot  Needle Type: Echogenic Stimulator Needle     Needle Length: 10cm  Needle Gauge: 21     Additional Needles:   Procedures:, nerve stimulator,,,,,,,   Nerve Stimulator or Paresthesia:  Response: 0.4 mA,   Additional Responses:   Narrative:  Start time: 12/27/2018 7:46 AM End time: 12/27/2018 7:56 AM Injection made incrementally with aspirations every 5 mL.  Performed by: Personally  Anesthesiologist: Lillia Abed, MD  Additional Notes: Monitors applied. Patient sedated. Sterile prep and drape,hand hygiene and sterile gloves were used. Relevant anatomy identified.Needle position confirmed.Local anesthetic injected incrementally after negative aspiration. Local anesthetic spread visualized around nerve(s). Vascular puncture avoided. No complications. Image printed for medical record.The patient tolerated the procedure well.  Additional Saphenous nerve block performed. 15cc Local Anesthetic mixture placed under ultrasonic guidance along the medio-inferior border of the Sartorious muscle 6 inches above the knee.  No Problems encountered.  Lillia Abed MD

## 2018-12-27 NOTE — Anesthesia Procedure Notes (Signed)
Procedure Name: Intubation Date/Time: 12/27/2018 8:48 AM Performed by: Raenette Rover, CRNA Pre-anesthesia Checklist: Patient identified, Emergency Drugs available, Suction available and Patient being monitored Patient Re-evaluated:Patient Re-evaluated prior to induction Oxygen Delivery Method: Circle system utilized Preoxygenation: Pre-oxygenation with 100% oxygen Induction Type: IV induction Ventilation: Mask ventilation without difficulty Laryngoscope Size: Mac and 3 Grade View: Grade I Tube type: Oral Tube size: 7.0 mm Number of attempts: 1 Airway Equipment and Method: Stylet Placement Confirmation: ETT inserted through vocal cords under direct vision,  positive ETCO2 and breath sounds checked- equal and bilateral Secured at: 20 cm Tube secured with: Tape Dental Injury: Teeth and Oropharynx as per pre-operative assessment

## 2018-12-27 NOTE — Anesthesia Preprocedure Evaluation (Signed)
Anesthesia Evaluation  Patient identified by MRN, date of birth, ID band Patient awake    Reviewed: Allergy & Precautions, NPO status , Patient's Chart, lab work & pertinent test results  Airway Mallampati: I  TM Distance: >3 FB Neck ROM: Full    Dental   Pulmonary    Pulmonary exam normal        Cardiovascular Normal cardiovascular exam     Neuro/Psych Anxiety Depression    GI/Hepatic   Endo/Other    Renal/GU      Musculoskeletal   Abdominal   Peds  Hematology   Anesthesia Other Findings   Reproductive/Obstetrics                             Anesthesia Physical Anesthesia Plan  ASA: II  Anesthesia Plan: General   Post-op Pain Management:  Regional for Post-op pain   Induction: Intravenous  PONV Risk Score and Plan: 3  Airway Management Planned: Oral ETT  Additional Equipment:   Intra-op Plan:   Post-operative Plan: Extubation in OR  Informed Consent: I have reviewed the patients History and Physical, chart, labs and discussed the procedure including the risks, benefits and alternatives for the proposed anesthesia with the patient or authorized representative who has indicated his/her understanding and acceptance.       Plan Discussed with: CRNA and Surgeon  Anesthesia Plan Comments:         Anesthesia Quick Evaluation

## 2018-12-27 NOTE — Anesthesia Postprocedure Evaluation (Signed)
Anesthesia Post Note  Patient: Laura Glass  Procedure(s) Performed: LEFT ACHILLES DEBRIDEMENT + RECONSTRUCTION (Left Ankle) CALCANEAL EXOSTECTOMY (Left Foot)     Patient location during evaluation: PACU Anesthesia Type: General Level of consciousness: awake and alert Pain management: pain level controlled Vital Signs Assessment: post-procedure vital signs reviewed and stable Respiratory status: spontaneous breathing, nonlabored ventilation, respiratory function stable and patient connected to nasal cannula oxygen Cardiovascular status: blood pressure returned to baseline and stable Postop Assessment: no apparent nausea or vomiting Anesthetic complications: no    Last Vitals:  Vitals:   12/27/18 1130 12/27/18 1210  BP: 120/74 132/82  Pulse: 83 86  Resp: 17 18  Temp:  37.1 C  SpO2: 96% 99%    Last Pain:  Vitals:   12/27/18 1210  TempSrc:   PainSc: 5                  Morgyn Marut DAVID

## 2018-12-27 NOTE — Transfer of Care (Signed)
Immediate Anesthesia Transfer of Care Note  Patient: Laura Glass  Procedure(s) Performed: LEFT ACHILLES DEBRIDEMENT + RECONSTRUCTION (Left Ankle) CALCANEAL EXOSTECTOMY (Left Foot)  Patient Location: PACU  Anesthesia Type:GA combined with regional for post-op pain  Level of Consciousness: awake, alert , oriented, drowsy and patient cooperative  Airway & Oxygen Therapy: Patient Spontanous Breathing  Post-op Assessment: Report given to RN and Post -op Vital signs reviewed and stable  Post vital signs: Reviewed and stable  Last Vitals:  Vitals Value Taken Time  BP 131/73 12/27/18 1015  Temp    Pulse 119 12/27/18 1017  Resp 17 12/27/18 1017  SpO2 98 % 12/27/18 1017  Vitals shown include unvalidated device data.  Last Pain:  Vitals:   12/27/18 0719  TempSrc: Oral  PainSc: 0-No pain         Complications: No apparent anesthesia complications

## 2018-12-27 NOTE — Discharge Instructions (Signed)
DR. Susa Simmonds FOOT & ANKLE SURGERY POST-OP INSTRUCTIONS   Pain Management 1. The numbing medicine and your leg will last around 18 hours, take a dose of your pain medicine as soon as you feel it wearing off to avoid rebound pain. 2. Keep your foot elevated above heart level.  Make sure that your heel hangs free ('floats'). 3. Take all prescribed medication as directed. 4. If taking narcotic pain medication you may want to use an over-the-counter stool softener to avoid constipation. 5. You may take over-the-counter NSAIDs (ibuprofen, naproxen, etc.) as well as over-the-counter acetaminophen as directed on the packaging as a supplement for your pain and may also use it to wean away from the prescription medication.  Activity ? Non-weightbearing ? Keep splint intact  First Postoperative Visit 1. Your first postop visit will be at least 2 weeks after surgery.  This should be scheduled when you schedule surgery. 2. If you do not have a postoperative visit scheduled please call [phone] to schedule an appointment. 3. At the appointment your incision will be evaluated for suture removal, x-rays will be obtained if necessary.  General Instructions 1. Swelling is very common after foot and ankle surgery.  It often takes 3 months for the foot and ankle to begin to feel comfortable.  Some amount of swelling will persist for 6-12 months. 2. DO NOT change the dressing.  If there is a problem with the dressing (too tight, loose, gets wet, etc.) please contact Dr. Donnie Mesa office. 3. DO NOT get the dressing wet.  For showers you can use an over-the-counter cast cover or wrap a washcloth around the top of your dressing and then cover it with a plastic bag and tape it to your leg. 4. DO NOT soak the incision (no tubs, pools, bath, etc.) until you have approval from Dr. Susa Simmonds.  Contact Dr. Garret Reddish office or go to Emergency Room if: 1. Temperature above 101 F. 2. Increasing pain that is unresponsive to pain  medication or elevation 3. Excessive redness or swelling in your foot 4. Dressing problems - excessive bloody drainage, looseness or tightness, or if dressing gets wet 5. Develop pain, swelling, warmth, or discoloration of your calf      Last Oxycodone given at 11:30. Follow instructions on prescription bottle for when to take next dose.      Post Anesthesia Home Care Instructions  Activity: Get plenty of rest for the remainder of the day. A responsible individual must stay with you for 24 hours following the procedure.  For the next 24 hours, DO NOT: -Drive a car -Advertising copywriter -Drink alcoholic beverages -Take any medication unless instructed by your physician -Make any legal decisions or sign important papers.  Meals: Start with liquid foods such as gelatin or soup. Progress to regular foods as tolerated. Avoid greasy, spicy, heavy foods. If nausea and/or vomiting occur, drink only clear liquids until the nausea and/or vomiting subsides. Call your physician if vomiting continues.  Special Instructions/Symptoms: Your throat may feel dry or sore from the anesthesia or the breathing tube placed in your throat during surgery. If this causes discomfort, gargle with warm salt water. The discomfort should disappear within 24 hours.  If you had a scopolamine patch placed behind your ear for the management of post- operative nausea and/or vomiting:  1. The medication in the patch is effective for 72 hours, after which it should be removed.  Wrap patch in a tissue and discard in the trash. Wash hands thoroughly with soap  and water. 2. You may remove the patch earlier than 72 hours if you experience unpleasant side effects which may include dry mouth, dizziness or visual disturbances. 3. Avoid touching the patch. Wash your hands with soap and water after contact with the patch.      Regional Anesthesia Blocks  1. Numbness or the inability to move the "blocked" extremity may last  from 3-48 hours after placement. The length of time depends on the medication injected and your individual response to the medication. If the numbness is not going away after 48 hours, call your surgeon.  2. The extremity that is blocked will need to be protected until the numbness is gone and the  Strength has returned. Because you cannot feel it, you will need to take extra care to avoid injury. Because it may be weak, you may have difficulty moving it or using it. You may not know what position it is in without looking at it while the block is in effect.  3. For blocks in the legs and feet, returning to weight bearing and walking needs to be done carefully. You will need to wait until the numbness is entirely gone and the strength has returned. You should be able to move your leg and foot normally before you try and bear weight or walk. You will need someone to be with you when you first try to ensure you do not fall and possibly risk injury.  4. Bruising and tenderness at the needle site are common side effects and will resolve in a few days.  5. Persistent numbness or new problems with movement should be communicated to the surgeon or the [place] [phone] [place] [phone]).

## 2018-12-27 NOTE — Progress Notes (Signed)
Assisted Dr. Conrad Viola with left, ultrasound guided, popliteal, popliteal/saphenous block. Side rails up, monitors on throughout procedure. See vital signs in flow sheet. Tolerated Procedure well.

## 2018-12-27 NOTE — Op Note (Signed)
Kamla Skilton female 33 y.o. 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  There is calcific tendinosis at the insertion of the Achilles  IMPLANTS: Arthrex speed bridge  INDICATIONS:33 y.o. female had an injury to her Achilles tendon while playing softball several years ago.  She had continued pain and developed chronic pain in the posterior aspect of her Achilles.  She has failed conservative treatment the form of injections, anti-inflammatories, activity modifications and the boot immobilization as well as physical therapy.  She was indicated for debridement of the Achilles and reconstruction with calcaneal exostectomy.  She understood the risk benefits alternatives of surgery which include but not limited to wound healing complications, infection, need for further surgery, tightness, weakness, damage to surrounding structures.  After weighing these risks he wished proceed with surgery.  PROCEDURE:The patient was identified in the preoperative holding area.  The left leg was marked by myself.  The consent was signed by myself and the patient.  Peripheral nerve blockade was performed by anesthesia.  They was taken to the operative suite where general anesthesia was induced without difficulty.  Preoperative antibiotics were given.  The patient was then placed prone on the operative table.  Chest rolls were used and all bony prominences were well-padded.  A thigh tourniquet was placed on the operative thigh.  The operative extremity was then prepped and draped in the usual sterile fashion.  Surgical  timeout was performed.  We began by making a longitudinal incision on the medial aspect of the distal Achilles tendon that curved into the central portion of the calcaneus tuberosity.  This was taken sharply down through skin, subcutaneous tissue and peritenon.  The tendon was identified and medial and lateral sub-peritenon flaps were created.    The tendon was fully exposed and removed from the calcaneal tuberosity.  The tendon was diseased appearing with tendinosis portions as well as calcified portions distally. The diseased tendon was sharply debrided and excised with a 15 blade and scissors. Then using a Freer and a Cobb elevator the proximal portions of adhesed tendon were lysed circumferentially   There was a large amount of inflamed synovial tissue in the retro-calcaneal area.  The inflamed bursal and synovial tissue was excisionally debrided with 15 blade and excised.  Then the calcaneal tuberosity was inspected and using a sagittal saw the Haglund's bump and enthesophytes were removed.  Rondure was used to ensure adequate removal of all bony prominences medially, laterally and posteriorly.  Acceptable bony debridement was performed and the bone was excised.  Then using the Arthrex drill the 4 drill tunnels were placed within the calcaneus and the holes were tapped.  Then the suture tape was run through the Achilles tendon within healthy-appearing tendon at the point of maximal tension with repair onto the calcaneus.  Then this was tied down to the calcaneus.  Then the suture bridge technique was used to fix this distally.  There was excellent fixation and tension on the Achilles after repair.  Then the medial, lateral and posterior aspect of the repair was inspected and palpated for any bony prominences and there were none found.  X-ray confirmed adequate bony resection.  The wound was then irrigated with normal saline and the tourniquet was  released.  Hemostasis was obtained using Bovie cautery and direct  compression.  Then the peritenon tissue was closed with a 3-0 Monocryl suture.  The subcuticular tissue was closed with 3-0 Monocryl and the skin with 3-0 nylon.   Xeroform, 4 x 4's and sterile she cotton was placed.  She was placed in a short leg nonweightbearing cast in a plantarflexed position.  There were no complications.  She tolerated the procedure well.  She was placed back in the supine position on her hospital bed and extubated.  She was taken to the recovery room in stable condition.  POST OPERATIVE INSTRUCTIONS: Nonweightbearing to left lower extremity Keep the cast in place and dry Call the office with concerns Follow-up in 2 weeks for cast removal, suture removal if appropriate and one lateral view of the foot on arrival.  TOURNIQUET TIME: 42 minutes  BLOOD LOSS:  Minimal         DRAINS: none         SPECIMEN: none       COMPLICATIONS:  * No complications entered in OR log *         Disposition: PACU - hemodynamically stable.         Condition: stable

## 2018-12-28 ENCOUNTER — Encounter (HOSPITAL_BASED_OUTPATIENT_CLINIC_OR_DEPARTMENT_OTHER): Payer: Self-pay | Admitting: Orthopaedic Surgery

## 2019-01-04 ENCOUNTER — Encounter: Payer: Self-pay | Admitting: Internal Medicine

## 2019-02-07 ENCOUNTER — Encounter: Payer: Self-pay | Admitting: Internal Medicine

## 2019-02-07 MED ORDER — ESCITALOPRAM OXALATE 10 MG PO TABS: 10.0000 mg | ORAL_TABLET | Freq: Every day | ORAL | 2 refills | Status: DC

## 2019-02-07 NOTE — Addendum Note (Signed)
Addended by: Lorre Munroe on: 02/07/2019 11:08 AM   Modules accepted: Orders

## 2019-02-12 ENCOUNTER — Encounter: Payer: Self-pay | Admitting: Internal Medicine

## 2019-02-12 ENCOUNTER — Ambulatory Visit (INDEPENDENT_AMBULATORY_CARE_PROVIDER_SITE_OTHER): Payer: BC Managed Care – PPO | Admitting: Internal Medicine

## 2019-02-12 DIAGNOSIS — R519 Headache, unspecified: Secondary | ICD-10-CM | POA: Diagnosis not present

## 2019-02-12 DIAGNOSIS — Z20822 Contact with and (suspected) exposure to covid-19: Secondary | ICD-10-CM

## 2019-02-12 DIAGNOSIS — R112 Nausea with vomiting, unspecified: Secondary | ICD-10-CM

## 2019-02-12 DIAGNOSIS — R0981 Nasal congestion: Secondary | ICD-10-CM | POA: Diagnosis not present

## 2019-02-12 NOTE — Progress Notes (Signed)
Virtual Visit via Video Note  I connected with Laura Glass on 02/12/19 at  4:00 PM EST by a video enabled telemedicine application and verified that I am speaking with the correct person using two identifiers.  Location: Patient: Home Provider: Office   I discussed the limitations of evaluation and management by telemedicine and the availability of in person appointments. The patient expressed understanding and agreed to proceed.  History of Present Illness:  Pt reports headache, congestion and nausea. This started 2-3 days ago. The headache is located above her eyes and radiates into her head and neck. She describes the pain as throbbing. She reports nausea and an episode of vomiting but denies dizziness, sensitivity to light or sound, or dizziness. She reports fatigue, chills and body aches but denies fever. She reports some nasal congestion but denies runny nose, ear pain, sore throat, loss of taste or smell, or SOB. She has tried Dayquil and Nyquil with minimal relief. She recently tutored someone with similar symptoms, that person is currently waiting on their COVID results.   Past Medical History:  Diagnosis Date  . Achilles tendonitis   . Allergy   . DVT (deep venous thrombosis) ([facility])   . History of chicken pox     Current Outpatient Medications  Medication Sig Dispense Refill  . escitalopram (LEXAPRO) 10 MG tablet Take 1 tablet (10 mg total) by mouth daily. 30 tablet 2  . Levonorgestrel (KYLEENA) 19.5 MG IUD by Intrauterine route. 03/2017     No current facility-administered medications for this visit.    No Known Allergies  Family History  Problem Relation Age of Onset  . Diabetes Mother   . Hypertension Mother   . Hypertension Father     Social History   Socioeconomic History  . Marital status: Single    Spouse name: Not on file  . Number of children: Not on file  . Years of education: Not on file  . Highest education level: Not on file  Occupational  History  . Not on file  Tobacco Use  . Smoking status: Never Smoker  . Smokeless tobacco: Never Used  Substance and Sexual Activity  . Alcohol use: Yes    Comment: occasional  . Drug use: No  . Sexual activity: Yes    Birth control/protection: I.U.D.  Other Topics Concern  . Not on file  Social History Narrative  . Not on file   Social Determinants of Health   Financial Resource Strain:   . Difficulty of Paying Living Expenses: Not on file  Food Insecurity:   . Worried About Charity fundraiser in the Last Year: Not on file  . Ran Out of Food in the Last Year: Not on file  Transportation Needs:   . Lack of Transportation (Medical): Not on file  . Lack of Transportation (Non-Medical): Not on file  Physical Activity:   . Days of Exercise per Week: Not on file  . Minutes of Exercise per Session: Not on file  Stress:   . Feeling of Stress : Not on file  Social Connections:   . Frequency of Communication with Friends and Family: Not on file  . Frequency of Social Gatherings with Friends and Family: Not on file  . Attends Religious Services: Not on file  . Active Member of Clubs or Organizations: Not on file  . Attends Archivist Meetings: Not on file  . Marital Status: Not on file  Intimate Partner Violence:   . Fear of Current  or Ex-Partner: Not on file  . Emotionally Abused: Not on file  . Physically Abused: Not on file  . Sexually Abused: Not on file     Constitutional: Pt reports headaches, fatigue. Denies fever, malaise, or abrupt weight changes.  HEENT: Pt reports nasal congestion. Denies eye pain, eye redness, ear pain, ringing in the ears, wax buildup, runny nose, bloody nose, or sore throat. Respiratory: Denies difficulty breathing, shortness of breath, cough or sputum production.   Cardiovascular: Denies chest pain, chest tightness, palpitations or swelling in the hands or feet.  Gastrointestinal: Pt reports nausea and vomiting. Denies abdominal pain,  bloating, constipation, diarrhea or blood in the stool.  Musculoskeletal: Pt reports generalized body aches. Denies decrease in range of motion, difficulty with gait, or joint pain and swelling.  Skin: Denies redness, rashes, lesions or ulcercations.  Neurological: Denies dizziness, difficulty with memory, difficulty with speech or problems with balance and coordination.    No other specific complaints in a complete review of systems (except as listed in HPI above).    Observations/Objective:   Wt Readings from Last 3 Encounters:  12/27/18 163 lb 12.8 oz (74.3 kg)  12/11/18 160 lb (72.6 kg)  11/13/18 160 lb (72.6 kg)    General: Appears her stated age, in NAD. HEENT: Head: normal shape and size;  Nose: slightly congested.  Throat/Mouth: no hoarseness noted. Pulmonary/Chest: Normal effort. No respiratory distress.  Neurological: Alert and oriented.   BMET    Component Value Date/Time   NA 139 03/30/2018 1515   K 4.1 03/30/2018 1515   CL 104 03/30/2018 1515   CO2 29 03/30/2018 1515   GLUCOSE 84 03/30/2018 1515   BUN 12 03/30/2018 1515   CREATININE 0.90 03/30/2018 1515   CALCIUM 9.7 03/30/2018 1515    Lipid Panel  No results found for: CHOL, TRIG, HDL, CHOLHDL, VLDL, LDLCALC  CBC    Component Value Date/Time   WBC 7.6 03/30/2018 1515   RBC 4.76 03/30/2018 1515   HGB 14.5 03/30/2018 1515   HCT 42.6 03/30/2018 1515   PLT 218.0 03/30/2018 1515   MCV 89.5 03/30/2018 1515   MCV 89.9 07/20/2011 1628   MCH 29.0 07/20/2011 1628   MCHC 34.1 03/30/2018 1515   RDW 12.0 03/30/2018 1515    Hgb A1C No results found for: HGBA1C     Assessment and Plan:  Acute Headache, Nasal Congestion, Nausea and Vomiting:  Likely COVID Info given on getting a Covid test Discussed symptomatic care: rest and fluids Encouraged Tylenol for headache, Flonase for congestion Discussed the importance of self quarantine for 10 days if Covid test positive Encouraged masking, frequent  handwashing and social distancing even in the home  ER precautions discussed  Follow Up Instructions:    I discussed the assessment and treatment plan with the patient. The patient was provided an opportunity to ask questions and all were answered. The patient agreed with the plan and demonstrated an understanding of the instructions.   The patient was advised to call back or seek an in-person evaluation if the symptoms worsen or if the condition fails to improve as anticipated.    Nicki Reaper, NP

## 2019-02-13 ENCOUNTER — Encounter: Payer: Self-pay | Admitting: Internal Medicine

## 2019-02-13 ENCOUNTER — Ambulatory Visit: Payer: BC Managed Care – PPO | Attending: Internal Medicine

## 2019-02-13 DIAGNOSIS — Z20822 Contact with and (suspected) exposure to covid-19: Secondary | ICD-10-CM

## 2019-02-13 NOTE — Progress Notes (Signed)
   Covid-19 Vaccination Clinic  Name:  Laura Glass    MRN: [id number] DOB: 1985-09-11  02/13/2019  [name] was observed post Covid-19 immunization for 15 minutes without incidence. She was provided with Vaccine Information Sheet and instruction to access the V-Safe system.   [name] was instructed to call 911 with any severe reactions post vaccine: Marland Kitchen Difficulty breathing  . Swelling of your face and throat  . A fast heartbeat  . A bad rash all over your body  . Dizziness and weakness

## 2019-02-13 NOTE — Patient Instructions (Signed)
COVID-19 COVID-19 is a respiratory infection that is caused by a virus called severe acute respiratory syndrome coronavirus 2 (SARS-CoV-2). The disease is also known as coronavirus disease or novel coronavirus. In some people, the virus may not cause any symptoms. In others, it may cause a serious infection. The infection can get worse quickly and can lead to complications, such as:  Pneumonia, or infection of the lungs.  Acute respiratory distress syndrome or ARDS. This is a condition in which fluid build-up in the lungs prevents the lungs from filling with air and passing oxygen into the blood.  Acute respiratory failure. This is a condition in which there is not enough oxygen passing from the lungs to the body or when carbon dioxide is not passing from the lungs out of the body.  Sepsis or septic shock. This is a serious bodily reaction to an infection.  Blood clotting problems.  Secondary infections due to bacteria or fungus.  Organ failure. This is when your body's organs stop working. The virus that causes COVID-19 is contagious. This means that it can spread from person to person through droplets from coughs and sneezes (respiratory secretions). What are the causes? This illness is caused by a virus. You may catch the virus by:  Breathing in droplets from an infected person. Droplets can be spread by a person breathing, speaking, singing, coughing, or sneezing.  Touching something, like a table or a doorknob, that was exposed to the virus (contaminated) and then touching your mouth, nose, or eyes. What increases the risk? Risk for infection You are more likely to be infected with this virus if you:  Are within 6 feet (2 meters) of a person with COVID-19.  Provide care for or live with a person who is infected with COVID-19.  Spend time in crowded indoor spaces or live in shared housing. Risk for serious illness You are more likely to become seriously ill from the virus if you:   Are 50 years of age or older. The higher your age, the more you are at risk for serious illness.  Live in a nursing home or long-term care facility.  Have cancer.  Have a long-term (chronic) disease such as: ? Chronic lung disease, including chronic obstructive pulmonary disease or asthma. ? A long-term disease that lowers your body's ability to fight infection (immunocompromised). ? Heart disease, including heart failure, a condition in which the arteries that lead to the heart become narrow or blocked (coronary artery disease), a disease which makes the heart muscle thick, weak, or stiff (cardiomyopathy). ? Diabetes. ? Chronic kidney disease. ? Sickle cell disease, a condition in which red blood cells have an abnormal "sickle" shape. ? Liver disease.  Are obese. What are the signs or symptoms? Symptoms of this condition can range from mild to severe. Symptoms may appear any time from 2 to 14 days after being exposed to the virus. They include:  A fever or chills.  A cough.  Difficulty breathing.  Headaches, body aches, or muscle aches.  Runny or stuffy (congested) nose.  A sore throat.  New loss of taste or smell. Some people may also have stomach problems, such as nausea, vomiting, or diarrhea. Other people may not have any symptoms of COVID-19. How is this diagnosed? This condition may be diagnosed based on:  Your signs and symptoms, especially if: ? You live in an area with a COVID-19 outbreak. ? You recently traveled to or from an area where the virus is common. ? You   provide care for or live with a person who was diagnosed with COVID-19. ? You were exposed to a person who was diagnosed with COVID-19.  A physical exam.  Lab tests, which may include: ? Taking a sample of fluid from the back of your nose and throat (nasopharyngeal fluid), your nose, or your throat using a swab. ? A sample of mucus from your lungs (sputum). ? Blood tests.  Imaging tests, which  may include, X-rays, CT scan, or ultrasound. How is this treated? At present, there is no medicine to treat COVID-19. Medicines that treat other diseases are being used on a trial basis to see if they are effective against COVID-19. Your health care provider will talk with you about ways to treat your symptoms. For most people, the infection is mild and can be managed at home with rest, fluids, and over-the-counter medicines. Treatment for a serious infection usually takes places in a hospital intensive care unit (ICU). It may include one or more of the following treatments. These treatments are given until your symptoms improve.  Receiving fluids and medicines through an IV.  Supplemental oxygen. Extra oxygen is given through a tube in the nose, a face mask, or a hood.  Positioning you to lie on your stomach (prone position). This makes it easier for oxygen to get into the lungs.  Continuous positive airway pressure (CPAP) or bi-level positive airway pressure (BPAP) machine. This treatment uses mild air pressure to keep the airways open. A tube that is connected to a motor delivers oxygen to the body.  Ventilator. This treatment moves air into and out of the lungs by using a tube that is placed in your windpipe.  Tracheostomy. This is a procedure to create a hole in the neck so that a breathing tube can be inserted.  Extracorporeal membrane oxygenation (ECMO). This procedure gives the lungs a chance to recover by taking over the functions of the heart and lungs. It supplies oxygen to the body and removes carbon dioxide. Follow these instructions at home: Lifestyle  If you are sick, stay home except to get medical care. Your health care provider will tell you how long to stay home. Call your health care provider before you go for medical care.  Rest at home as told by your health care provider.  Do not use any products that contain nicotine or tobacco, such as cigarettes, e-cigarettes, and  chewing tobacco. If you need help quitting, ask your health care provider.  Return to your normal activities as told by your health care provider. Ask your health care provider what activities are safe for you. General instructions  Take over-the-counter and prescription medicines only as told by your health care provider.  Drink enough fluid to keep your urine pale yellow.  Keep all follow-up visits as told by your health care provider. This is important. How is this prevented?  There is no vaccine to help prevent COVID-19 infection. However, there are steps you can take to protect yourself and others from this virus. To protect yourself:   Do not travel to areas where COVID-19 is a risk. The areas where COVID-19 is reported change often. To identify high-risk areas and travel restrictions, check the CDC travel website: wwwnc.cdc.gov/travel/notices  If you live in, or must travel to, an area where COVID-19 is a risk, take precautions to avoid infection. ? Stay away from people who are sick. ? Wash your hands often with soap and water for 20 seconds. If soap and water   are not available, use an alcohol-based hand sanitizer. ? Avoid touching your mouth, face, eyes, or nose. ? Avoid going out in public, follow guidance from your state and local health authorities. ? If you must go out in public, wear a cloth face covering or face mask. Make sure your mask covers your nose and mouth. ? Avoid crowded indoor spaces. Stay at least 6 feet (2 meters) away from others. ? Disinfect objects and surfaces that are frequently touched every day. This may include:  Counters and tables.  Doorknobs and light switches.  Sinks and faucets.  Electronics, such as phones, remote controls, keyboards, computers, and tablets. To protect others: If you have symptoms of COVID-19, take steps to prevent the virus from spreading to others.  If you think you have a COVID-19 infection, contact your health care  provider right away. Tell your health care team that you think you may have a COVID-19 infection.  Stay home. Leave your house only to seek medical care. Do not use public transport.  Do not travel while you are sick.  Wash your hands often with soap and water for 20 seconds. If soap and water are not available, use alcohol-based hand sanitizer.  Stay away from other members of your household. Let healthy household members care for children and pets, if possible. If you have to care for children or pets, wash your hands often and wear a mask. If possible, stay in your own room, separate from others. Use a different bathroom.  Make sure that all people in your household wash their hands well and often.  Cough or sneeze into a tissue or your sleeve or elbow. Do not cough or sneeze into your hand or into the air.  Wear a cloth face covering or face mask. Make sure your mask covers your nose and mouth. Where to find more information  Centers for Disease Control and Prevention: www.cdc.gov/coronavirus/2019-ncov/index.html  World Health Organization: www.who.int/health-topics/coronavirus Contact a health care provider if:  You live in or have traveled to an area where COVID-19 is a risk and you have symptoms of the infection.  You have had contact with someone who has COVID-19 and you have symptoms of the infection. Get help right away if:  You have trouble breathing.  You have pain or pressure in your chest.  You have confusion.  You have bluish lips and fingernails.  You have difficulty waking from sleep.  You have symptoms that get worse. These symptoms may represent a serious problem that is an emergency. Do not wait to see if the symptoms will go away. Get medical help right away. Call your local emergency services (911 in the [place] Do not drive yourself to the hospital. Let the emergency medical personnel know if you think you have COVID-19. Summary  COVID-19 is a  respiratory infection that is caused by a virus. It is also known as coronavirus disease or novel coronavirus. It can cause serious infections, such as pneumonia, acute respiratory distress syndrome, acute respiratory failure, or sepsis.  The virus that causes COVID-19 is contagious. This means that it can spread from person to person through droplets from breathing, speaking, singing, coughing, or sneezing.  You are more likely to develop a serious illness if you are 50 years of age or older, have a weak immune system, live in a nursing home, or have chronic disease.  There is no medicine to treat COVID-19. Your health care provider will talk with you about ways to treat your symptoms.    Take steps to protect yourself and others from infection. Wash your hands often and disinfect objects and surfaces that are frequently touched every day. Stay away from people who are sick and wear a mask if you are sick. This information is not intended to replace advice given to you by your health care provider. Make sure you discuss any questions you have with your health care provider. Document Revised: 11/09/2018 Document Reviewed: 02/15/2018 [employer]vier Patient Education  2020 [employer]  

## 2019-02-15 LAB — NOVEL CORONAVIRUS, NAA: SARS-CoV-2, NAA: DETECTED — AB

## 2019-03-01 ENCOUNTER — Other Ambulatory Visit: Payer: Self-pay | Admitting: Internal Medicine

## 2019-04-11 ENCOUNTER — Encounter: Payer: Self-pay | Admitting: Internal Medicine

## 2019-05-27 ENCOUNTER — Encounter: Payer: Self-pay | Admitting: Internal Medicine

## 2019-05-30 ENCOUNTER — Other Ambulatory Visit: Payer: Self-pay | Admitting: Internal Medicine

## 2020-02-11 ENCOUNTER — Encounter: Payer: Self-pay | Admitting: Internal Medicine

## 2020-02-12 ENCOUNTER — Other Ambulatory Visit (INDEPENDENT_AMBULATORY_CARE_PROVIDER_SITE_OTHER): Payer: Self-pay

## 2020-02-12 ENCOUNTER — Other Ambulatory Visit: Payer: Self-pay

## 2020-02-12 ENCOUNTER — Telehealth (INDEPENDENT_AMBULATORY_CARE_PROVIDER_SITE_OTHER): Payer: Self-pay | Admitting: Internal Medicine

## 2020-02-12 ENCOUNTER — Encounter: Payer: Self-pay | Admitting: Internal Medicine

## 2020-02-12 ENCOUNTER — Other Ambulatory Visit: Payer: Self-pay | Admitting: Internal Medicine

## 2020-02-12 DIAGNOSIS — H9203 Otalgia, bilateral: Secondary | ICD-10-CM

## 2020-02-12 DIAGNOSIS — J029 Acute pharyngitis, unspecified: Secondary | ICD-10-CM

## 2020-02-12 DIAGNOSIS — Z20822 Contact with and (suspected) exposure to covid-19: Secondary | ICD-10-CM

## 2020-02-12 DIAGNOSIS — R059 Cough, unspecified: Secondary | ICD-10-CM

## 2020-02-12 DIAGNOSIS — R0981 Nasal congestion: Secondary | ICD-10-CM

## 2020-02-12 DIAGNOSIS — R519 Headache, unspecified: Secondary | ICD-10-CM

## 2020-02-12 LAB — POCT RAPID STREP A (OFFICE): Rapid Strep A Screen: NEGATIVE

## 2020-02-12 MED ORDER — PREDNISONE 10 MG PO TABS: ORAL_TABLET | ORAL | 0 refills | Status: DC

## 2020-02-12 NOTE — Progress Notes (Addendum)
Virtual Visit via Video Note  I connected with Laura Glass on 02/12/20 at  2:45 PM EST by a video enabled telemedicine application and verified that I am speaking with the correct person using two identifiers.  Location: Patient: Home Provider: Office  Person's participating in this video call: Laura Reaper, NP-C and Laura Glass.  I discussed the limitations of evaluation and management by telemedicine and the availability of in person appointments. The patient expressed understanding and agreed to proceed.  History of Present Illness:  Pt reports headache, ear pain, sore throat and cough. This started 4 days ago. The headache is located temples. She describes the headache as achy, but denies dizziness or visual changes. She describes the ear pain as achy, without drainage, ringing or loss of hearing.  She reports it feels like she is swallowing glass. She has noticed redness and white spots in the back of her throat. The cough is productive of clear mucous. She denies runny nose, loss of taste/smell, or SOB. She denies fever, chills or body aches. She took a home covid test which was negative yesterday. She has tried Dayquil, Nyquil, Chloraseptic and Ibuprofen with minimal relief of symptoms. She has had exposure to Covid and has not been vaccinated.   Past Medical History:  Diagnosis Date  . Achilles tendonitis   . Allergy   . DVT (deep venous thrombosis) ([facility])   . History of chicken pox     Current Outpatient Medications  Medication Sig Dispense Refill  . escitalopram (LEXAPRO) 10 MG tablet TAKE 1 TABLET BY MOUTH EVERY DAY 90 tablet 0  . Levonorgestrel (KYLEENA) 19.5 MG IUD by Intrauterine route. 03/2017     No current facility-administered medications for this visit.    No Known Allergies  Family History  Problem Relation Age of Onset  . Diabetes Mother   . Hypertension Mother   . Hypertension Father     Social History   Socioeconomic History  . Marital status:  Single    Spouse name: Not on file  . Number of children: Not on file  . Years of education: Not on file  . Highest education level: Not on file  Occupational History  . Not on file  Tobacco Use  . Smoking status: Never Smoker  . Smokeless tobacco: Never Used  Vaping Use  . Vaping Use: Never used  Substance and Sexual Activity  . Alcohol use: Yes    Comment: occasional  . Drug use: No  . Sexual activity: Yes    Birth control/protection: I.U.D.  Other Topics Concern  . Not on file  Social History Narrative  . Not on file   Social Determinants of Health   Financial Resource Strain: Not on file  Food Insecurity: Not on file  Transportation Needs: Not on file  Physical Activity: Not on file  Stress: Not on file  Social Connections: Not on file  Intimate Partner Violence: Not on file     Constitutional: Pt reports headache. Denies fever, malaise, fatigue, or abrupt weight changes.  HEENT: Pt reports ear pain, sore throat. Denies eye pain, eye redness, ringing in the ears, wax buildup, runny nose, bloody nose Respiratory: Pt reports cough. Denies difficulty breathing, shortness of breath, cough or sputum production.   Cardiovascular: Denies chest pain, chest tightness, palpitations or swelling in the hands or feet.  Gastrointestinal: Denies abdominal pain, bloating, constipation, diarrhea or blood in the stool.    No other specific complaints in a complete review of systems (except as  listed in HPI above).  Observations/Objective:  Wt Readings from Last 3 Encounters:  12/27/18 163 lb 12.8 oz (74.3 kg)  12/11/18 160 lb (72.6 kg)  11/13/18 160 lb (72.6 kg)    General: In NAD. HEENT:  Nose: no congestion noted ; Throat/Mouth: hoarseness noted. Pulmonary/Chest: Normal effort. No respiratory distress.  Neurological: Alert and oriented.   BMET    Component Value Date/Time   NA 139 03/30/2018 1515   K 4.1 03/30/2018 1515   CL 104 03/30/2018 1515   CO2 29 03/30/2018  1515   GLUCOSE 84 03/30/2018 1515   BUN 12 03/30/2018 1515   CREATININE 0.90 03/30/2018 1515   CALCIUM 9.7 03/30/2018 1515    Lipid Panel  No results found for: CHOL, TRIG, HDL, CHOLHDL, VLDL, LDLCALC  CBC    Component Value Date/Time   WBC 7.6 03/30/2018 1515   RBC 4.76 03/30/2018 1515   HGB 14.5 03/30/2018 1515   HCT 42.6 03/30/2018 1515   PLT 218.0 03/30/2018 1515   MCV 89.5 03/30/2018 1515   MCV 89.9 07/20/2011 1628   MCH 29.0 07/20/2011 1628   MCHC 34.1 03/30/2018 1515   RDW 12.0 03/30/2018 1515    Hgb A1C No results found for: HGBA1C      Assessment and Plan:  Acute Headache, Ear Pain, Sore Throat and Cough:  Will have her come for [facility] strep and Covid testing If strep positive, will send in Amoxil 500 mg TID x 10 days If strep negative, will send in Prednisone 10 mg 6 day taper while awaiting Covid results Encouraged rest and fluids Tylenol as needed for aches and pain Recommend masking, frequent handwashing, social distancing and self quarantine until Covid results are back.  Return precautions discussed Follow Up Instructions:    I discussed the assessment and treatment plan with the patient. The patient was provided an opportunity to ask questions and all were answered. The patient agreed with the plan and demonstrated an understanding of the instructions.   The patient was advised to call back or seek an in-person evaluation if the symptoms worsen or if the condition fails to improve as anticipated.    Laura Reaper, NP

## 2020-02-12 NOTE — Addendum Note (Signed)
Addended by: Lorre Munroe on: 02/12/2020 04:30 PM   Modules accepted: Orders

## 2020-02-12 NOTE — Patient Instructions (Signed)
Person Under Monitoring Name: Laura Glass  Location: [address] [place] [address]   Infection Prevention Recommendations for Individuals Confirmed to have, or Being Evaluated for, 2019 Novel Coronavirus (COVID-19) Infection Who Receive Care at Home  Individuals who are confirmed to have, or are being evaluated for, COVID-19 should follow the prevention steps below until a healthcare provider or local or state health department says they can return to normal activities.  Stay home except to get medical care You should restrict activities outside your home, except for getting medical care. Do not go to work, school, or public areas, and do not use public transportation or taxis.  Call ahead before visiting your doctor Before your medical appointment, call the healthcare provider and tell them that you have, or are being evaluated for, COVID-19 infection. This will help the healthcare provider's office take steps to keep other people from getting infected. Ask your healthcare provider to call the local or state health department.  Monitor your symptoms Seek prompt medical attention if your illness is worsening (e.g., difficulty breathing). Before going to your medical appointment, call the healthcare provider and tell them that you have, or are being evaluated for, COVID-19 infection. Ask your healthcare provider to call the local or state health department.  Wear a facemask You should wear a facemask that covers your nose and mouth when you are in the same room with other people and when you visit a healthcare provider. People who live with or visit you should also wear a facemask while they are in the same room with you.  Separate yourself from other people in your home As much as possible, you should stay in a different room from other people in your home. Also, you should use a separate bathroom, if available.  Avoid sharing household items You should  not share dishes, drinking glasses, cups, eating utensils, towels, bedding, or other items with other people in your home. After using these items, you should wash them thoroughly with soap and water.  Cover your coughs and sneezes Cover your mouth and nose with a tissue when you cough or sneeze, or you can cough or sneeze into your sleeve. Throw used tissues in a lined trash can, and immediately wash your hands with soap and water for at least 20 seconds or use an alcohol-based hand rub.  Wash your [employer] your hands often and thoroughly with soap and water for at least 20 seconds. You can use an alcohol-based hand sanitizer if soap and water are not available and if your hands are not visibly dirty. Avoid touching your eyes, nose, and mouth with unwashed hands.   Prevention Steps for Caregivers and Household Members of Individuals Confirmed to have, or Being Evaluated for, COVID-19 Infection Being Cared for in the Home  If you live with, or provide care at home for, a person confirmed to have, or being evaluated for, COVID-19 infection please follow these guidelines to prevent infection:  Follow healthcare provider's instructions Make sure that you understand and can help the patient follow any healthcare provider instructions for all care.  Provide for the patient's basic needs You should help the patient with basic needs in the home and provide support for getting groceries, prescriptions, and other personal needs.  Monitor the patient's symptoms If they are getting sicker, call his or her medical provider and tell them that the patient has, or is being evaluated for, COVID-19 infection. This will help the healthcare  provider's office take steps to keep other people from getting infected. Ask the healthcare provider to call the local or state health department.  Limit the number of people who have contact with the patient  If possible, have only one caregiver for the  patient.  Other household members should stay in another home or place of residence. If this is not possible, they should stay  in another room, or be separated from the patient as much as possible. Use a separate bathroom, if available.  Restrict visitors who do not have an essential need to be in the home.  Keep older adults, very young children, and other sick people away from the patient Keep older adults, very young children, and those who have compromised immune systems or chronic health conditions away from the patient. This includes people with chronic heart, lung, or kidney conditions, diabetes, and cancer.  Ensure good ventilation Make sure that shared spaces in the home have good air flow, such as from an air conditioner or an opened window, weather permitting.  Wash your hands often  Wash your hands often and thoroughly with soap and water for at least 20 seconds. You can use an alcohol based hand sanitizer if soap and water are not available and if your hands are not visibly dirty.  Avoid touching your eyes, nose, and mouth with unwashed hands.  Use disposable paper towels to dry your hands. If not available, use dedicated cloth towels and replace them when they become wet.  Wear a facemask and gloves  Wear a disposable facemask at all times in the room and gloves when you touch or have contact with the patient's blood, body fluids, and/or secretions or excretions, such as sweat, saliva, sputum, nasal mucus, vomit, urine, or feces.  Ensure the mask fits over your nose and mouth tightly, and do not touch it during use.  Throw out disposable facemasks and gloves after using them. Do not reuse.  Wash your hands immediately after removing your facemask and gloves.  If your personal clothing becomes contaminated, carefully remove clothing and launder. Wash your hands after handling contaminated clothing.  Place all used disposable facemasks, gloves, and other waste in a lined  container before disposing them with other household waste.  Remove gloves and wash your hands immediately after handling these items.  Do not share dishes, glasses, or other household items with the patient  Avoid sharing household items. You should not share dishes, drinking glasses, cups, eating utensils, towels, bedding, or other items with a patient who is confirmed to have, or being evaluated for, COVID-19 infection.  After the person uses these items, you should wash them thoroughly with soap and water.  Wash laundry thoroughly  Immediately remove and wash clothes or bedding that have blood, body fluids, and/or secretions or excretions, such as sweat, saliva, sputum, nasal mucus, vomit, urine, or feces, on them.  Wear gloves when handling laundry from the patient.  Read and follow directions on labels of laundry or clothing items and detergent. In general, wash and dry with the warmest temperatures recommended on the label.  Clean all areas the individual has used often  Clean all touchable surfaces, such as counters, tabletops, doorknobs, bathroom fixtures, toilets, phones, keyboards, tablets, and bedside tables, every day. Also, clean any surfaces that may have blood, body fluids, and/or secretions or excretions on them.  Wear gloves when cleaning surfaces the patient has come in contact with.  Use a diluted bleach solution (e.g., dilute bleach with  1 part bleach and 10 parts water) or a household disinfectant with a label that says EPA-registered for coronaviruses. To make a bleach solution at home, add 1 tablespoon of bleach to 1 quart (4 cups) of water. For a larger supply, add  cup of bleach to 1 gallon (16 cups) of water.  Read labels of cleaning products and follow recommendations provided on product labels. Labels contain instructions for safe and effective use of the cleaning product including precautions you should take when applying the product, such as wearing gloves or  eye protection and making sure you have good ventilation during use of the product.  Remove gloves and wash hands immediately after cleaning.  Monitor yourself for signs and symptoms of illness Caregivers and household members are considered close contacts, should monitor their health, and will be asked to limit movement outside of the home to the extent possible. Follow the monitoring steps for close contacts listed on the symptom monitoring form.   ? If you have additional questions, contact your local health department or call the epidemiologist on call at [phone] (available 24/7). ? This guidance is subject to change. For the most up-to-date guidance from [facility], please refer to their website: [email or web address]

## 2020-02-14 LAB — SARS-COV-2, NAA 2 DAY TAT

## 2020-02-14 LAB — NOVEL CORONAVIRUS, NAA: SARS-CoV-2, NAA: NOT DETECTED

## 2020-02-14 LAB — SPECIMEN STATUS REPORT

## 2021-03-15 ENCOUNTER — Encounter: Payer: Self-pay | Admitting: Internal Medicine

## 2021-03-24 ENCOUNTER — Encounter: Payer: Self-pay | Admitting: Family

## 2021-04-05 ENCOUNTER — Ambulatory Visit
Admission: EM | Admit: 2021-04-05 | Discharge: 2021-04-05 | Disposition: A | Discharge status: Home / Self Care | Payer: BC Managed Care – PPO | Attending: Physician Assistant | Admitting: Physician Assistant

## 2021-04-05 ENCOUNTER — Other Ambulatory Visit: Payer: Self-pay

## 2021-04-05 DIAGNOSIS — J029 Acute pharyngitis, unspecified: Secondary | ICD-10-CM | POA: Diagnosis present

## 2021-04-05 LAB — POCT RAPID STREP A (OFFICE): Rapid Strep A Screen: NEGATIVE

## 2021-04-05 MED ORDER — PREDNISONE 20 MG PO TABS: 40.0000 mg | ORAL_TABLET | Freq: Every day | ORAL | 0 refills | Status: AC

## 2021-04-05 NOTE — ED Triage Notes (Signed)
Pt c/o sore throat onset ~ 1 week ago states it self-resolved but has returned. Also c/o cough, nasal congestion, nausea (GI apt tomorrow),  ? ?Denies ear ache, headache, diarrhea, constipation, body aches or chills  ? ?

## 2021-04-05 NOTE — Discharge Instructions (Signed)
?  Strep test was negative. We will order throat culture, in the meantime steroid sent to pharmacy as discussed. Follow up with any further concerns.  ?

## 2021-04-05 NOTE — ED Provider Notes (Signed)
?[place] [facility] ? ? ? ?CSN: [id number] ?Arrival date & time: 04/05/21  [id number] ? ? ?  ? ?History   ?Chief Complaint ?Chief Complaint  ?Patient presents with  ? Sore Throat  ? ? ?HPI ?Laura Glass is a 36 y.o. female.  ? ?Patient here today for evaluation of sore throat, nasal congestion, cough from congestion that started about a week ago, improved and then returned. She reports sore throat is her worst symptom. She has not had any fever. She has GI problems at baseline but nothing new or worse with current illness.  ? ?The history is provided by the patient.  ? ?Past Medical History:  ?Diagnosis Date  ? Achilles tendonitis   ? Allergy   ? DVT (deep venous thrombosis) ([place])   ? History of chicken pox   ? ? ?Patient Active Problem List  ? Diagnosis Date Noted  ? Anxiety and depression 05/03/2018  ? Seasonal allergies 09/07/2015  ? ? ?Past Surgical History:  ?Procedure Laterality Date  ? ACHILLES TENDON SURGERY Left 12/27/2018  ? Procedure: LEFT ACHILLES DEBRIDEMENT + RECONSTRUCTION;  Surgeon: Erle Crocker, MD;  Location: [facility];  Service: Orthopedics;  Laterality: Left;  TOTAL SURGERY REQUEST TIME: 1.5 HOURS  ? 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 Procedure: CALCANEAL EXOSTECTOMY;  Surgeon: Erle Crocker, MD;  Location: [facility];  Service: Orthopedics;  Laterality: Left;  ? KNEE SURGERY Left 02/28/2017  ? WISDOM TOOTH EXTRACTION    ? ? ?OB History   ?No obstetric history on file. ?  ? ? ? ?Home Medications   ? ?Prior to Admission medications   ?Medication Sig Start Date End Date Taking? Authorizing Provider  ?predniSONE (DELTASONE) 20 MG tablet Take 2 tablets (40 mg total) by mouth daily with breakfast for 5 days. 04/05/21 04/10/21 Yes Francene Finders, PA-C  ?DULoxetine (CYMBALTA) 60 MG capsule Take 60 mg by mouth every morning. 03/09/21   [provider]  ?escitalopram (LEXAPRO) 10 MG tablet TAKE 1 TABLET BY MOUTH EVERY DAY 05/30/19   Jearld Fenton, NP  ?lamoTRIgine (LAMICTAL) 200 MG tablet Take 200 mg by mouth daily. 01/21/21   [provider]  ?levonorgestrel (KYLEENA) 19.5 MG IUD by Intrauterine route. 03/2017    [provider]  ?traZODone (DESYREL) 50 MG tablet  12/31/19   [provider]  ? ? ?Family History ?Family History  ?Problem Relation Age of Onset  ? Diabetes Mother   ? Hypertension Mother   ? Hypertension Father   ? ? ?Social History ?Social History  ? ?Tobacco Use  ? Smoking status: Never  ? Smokeless tobacco: Never  ?Vaping Use  ? Vaping Use: Never used  ?Substance Use Topics  ? Alcohol use: Yes  ?  Comment: occasional  ? Drug use: No  ? ? ? ?Allergies   ?Patient has no known allergies. ? ? ?Review of Systems ?Review of Systems  ?Constitutional:  Negative for chills and fever.  ?HENT:  Positive for congestion and sore throat. Negative for ear pain.   ?Eyes:  Negative for discharge and redness.  ?Respiratory:  Positive for cough. Negative for shortness of breath and wheezing.   ?Gastrointestinal:  Negative for abdominal pain, diarrhea, nausea and vomiting.  ? ? ?Physical Exam ?Triage Vital Signs ?ED Triage Vitals  ?Enc Vitals Group  ?   BP   ?   Pulse   ?   Resp   ?   Temp   ?  Temp src   ?   SpO2   ?   Weight   ?   Height   ?   Head Circumference   ?   Peak Flow   ?   Pain Score   ?   Pain Loc   ?   Pain Edu?   ?   Excl. in [place]?   ? ?No data found. ? ?Updated Vital Signs ?BP 112/75 (BP Location: Left Arm)   Pulse 78   Temp 98.6 ?F (37 ?C) (Oral)   Resp 18   SpO2 99%  ?   ? ?Physical Exam ?Vitals and nursing note reviewed.  ?Constitutional:   ?   General: She is not in acute distress. ?   Appearance: Normal appearance. She is not ill-appearing.  ?HENT:  ?   Head: Normocephalic and atraumatic.  ?   Nose: Congestion present.  ?   Mouth/Throat:  ?   Mouth: Mucous membranes are moist.  ?   Pharynx: Posterior oropharyngeal erythema present. No oropharyngeal exudate.  ?Eyes:  ?   Conjunctiva/sclera: Conjunctivae  normal.  ?Cardiovascular:  ?   Rate and Rhythm: Normal rate and regular rhythm.  ?   Heart sounds: Normal heart sounds. No murmur heard. ?Pulmonary:  ?   Effort: Pulmonary effort is normal. No respiratory distress.  ?   Breath sounds: Normal breath sounds. No wheezing, rhonchi or rales.  ?Skin: ?   General: Skin is warm and dry.  ?Neurological:  ?   Mental Status: She is alert.  ?Psychiatric:     ?   Mood and Affect: Mood normal.     ?   Thought Content: Thought content normal.  ? ? ? ?UC Treatments / Results  ?Labs ?(all labs ordered are listed, but only abnormal results are displayed) ?Labs Reviewed  ?CULTURE, GROUP A STREP [facility])  ?POCT RAPID STREP A (OFFICE)  ? ? ?EKG ? ? ?Radiology ?No results found. ? ?Procedures ?Procedures (including critical care time) ? ?Medications Ordered in UC ?Medications - No data to display ? ?Initial Impression / Assessment and Plan / UC Course  ?I have reviewed the triage vital signs and the nursing notes. ? ?Pertinent labs & imaging results that were available during my care of the patient were reviewed by me and considered in my medical decision making (see chart for details). ? ? Strep test negative in office. Will order culture. Steroid burst prescribed to hopefully help with inflammation and improve symptoms. Encouraged follow up with any further concerns.  ? ?Final Clinical Impressions(s) / UC Diagnoses  ? ?Final diagnoses:  ?Acute pharyngitis, unspecified etiology  ? ? ? ?Discharge Instructions   ? ?  ? ?Strep test was negative. We will order throat culture, in the meantime steroid sent to pharmacy as discussed. Follow up with any further concerns.  ? ? ? ? ?ED Prescriptions   ? ? Medication Sig Dispense Auth. Provider  ? predniSONE (DELTASONE) 20 MG tablet Take 2 tablets (40 mg total) by mouth daily with breakfast for 5 days. 10 tablet Francene Finders, PA-C  ? ?  ? ?PDMP not reviewed this encounter. ?  ?Francene Finders, PA-C ?04/05/21 1024 ? ?

## 2021-04-06 ENCOUNTER — Encounter: Payer: Self-pay | Admitting: Internal Medicine

## 2021-04-06 ENCOUNTER — Ambulatory Visit: Payer: BC Managed Care – PPO | Admitting: Internal Medicine

## 2021-04-06 ENCOUNTER — Other Ambulatory Visit (INDEPENDENT_AMBULATORY_CARE_PROVIDER_SITE_OTHER): Payer: BC Managed Care – PPO

## 2021-04-06 VITALS — BP 120/82 | HR 74 | Ht 64.0 in | Wt 155.0 lb

## 2021-04-06 DIAGNOSIS — R112 Nausea with vomiting, unspecified: Secondary | ICD-10-CM

## 2021-04-06 DIAGNOSIS — R1013 Epigastric pain: Secondary | ICD-10-CM

## 2021-04-06 LAB — CBC WITH DIFFERENTIAL/PLATELET
Basophils Absolute: 0 10*3/uL (ref 0.0–0.1)
Basophils Relative: 0.2 % (ref 0.0–3.0)
Eosinophils Absolute: 0.1 10*3/uL (ref 0.0–0.7)
Eosinophils Relative: 0.8 % (ref 0.0–5.0)
HCT: 41.6 % (ref 36.0–46.0)
Hemoglobin: 14.2 g/dL (ref 12.0–15.0)
Lymphocytes Relative: 13.7 % (ref 12.0–46.0)
Lymphs Abs: 1.5 10*3/uL (ref 0.7–4.0)
MCHC: 34 g/dL (ref 30.0–36.0)
MCV: 89.3 fl (ref 78.0–100.0)
Monocytes Absolute: 0.7 10*3/uL (ref 0.1–1.0)
Monocytes Relative: 6.2 % (ref 3.0–12.0)
Neutro Abs: 8.6 10*3/uL — ABNORMAL HIGH (ref 1.4–7.7)
Neutrophils Relative %: 79.1 % — ABNORMAL HIGH (ref 43.0–77.0)
Platelets: 233 10*3/uL (ref 150.0–400.0)
RBC: 4.66 Mil/uL (ref 3.87–5.11)
RDW: 12.1 % (ref 11.5–15.5)
WBC: 10.8 10*3/uL — ABNORMAL HIGH (ref 4.0–10.5)

## 2021-04-06 LAB — HIGH SENSITIVITY CRP: CRP, High Sensitivity: 4.37 mg/L (ref 0.000–5.000)

## 2021-04-06 LAB — LIPASE: Lipase: 14 U/L (ref 11.0–59.0)

## 2021-04-06 LAB — COMPREHENSIVE METABOLIC PANEL
ALT: 26 U/L (ref 0–35)
AST: 19 U/L (ref 0–37)
Albumin: 4.8 g/dL (ref 3.5–5.2)
Alkaline Phosphatase: 53 U/L (ref 39–117)
BUN: 13 mg/dL (ref 6–23)
CO2: 28 mEq/L (ref 19–32)
Calcium: 9.8 mg/dL (ref 8.4–10.5)
Chloride: 103 mEq/L (ref 96–112)
Creatinine, Ser: 0.81 mg/dL (ref 0.40–1.20)
GFR: 95.41 mL/min (ref >60.00)
Glucose, Bld: 72 mg/dL (ref 70–99)
Potassium: 3.8 mEq/L (ref 3.5–5.1)
Sodium: 138 mEq/L (ref 135–145)
Total Bilirubin: 0.3 mg/dL (ref 0.2–1.2)
Total Protein: 7.4 g/dL (ref 6.0–8.3)

## 2021-04-06 MED ORDER — PANTOPRAZOLE SODIUM 40 MG PO TBEC: 40.0000 mg | DELAYED_RELEASE_TABLET | Freq: Every day | ORAL | 3 refills | Status: DC

## 2021-04-06 NOTE — Progress Notes (Signed)
HISTORY OF PRESENT ILLNESS: ? ?Laura Glass is a 36 y.o. 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  The patient tells me that in her 73s she developed problems with postprandial nausea often associated with vomiting.  In addition epigastric pain.  This seemed to ease with vomiting.  She volunteers that she does not have an eating disorder.  She tells me that the symptoms seem to abate for some time but have returned in recent years.  They have progressed with time.  She does report occasional pyrosis.  No hematemesis or melena.  She denies NSAID use.  Her weight has been stable.  No change in her bowel habits from baseline.  No family history of gastrointestinal disorders.  No prior GI work-up.  She denies that symptoms are affected by stress. ? ?REVIEW OF SYSTEMS: ? ?All non-GI ROS negative unless otherwise stated in the HPI except for night sweats ? ?Past Medical History:  ?Diagnosis Date  ? Achilles tendonitis   ? Allergy   ? DVT (deep venous thrombosis) (HCC)   ? History of chicken pox   ? ? ?Past Surgical History:  ?Procedure Laterality Date  ? ACHILLES TENDON SURGERY Left 12/27/2018  ? Procedure: LEFT ACHILLES DEBRIDEMENT + RECONSTRUCTION;  Surgeon: Terance Hart, MD;  Location: [facility];  Service: Orthopedics;  Laterality: Left;  TOTAL SURGERY REQUEST TIME: 1.5 HOURS  ? 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 Procedure: CALCANEAL EXOSTECTOMY;  Surgeon: Terance Hart, MD;  Location: [facility];  Service: Orthopedics;  Laterality: Left;  ? KNEE SURGERY Left 02/28/2017  ? WISDOM TOOTH EXTRACTION    ? ? ?Social History ?Laura Glass  reports that she has never smoked. She has never used smokeless tobacco. She reports current alcohol use. She reports that she does not use drugs. ? ?family history includes Cirrhosis in  her father; Diabetes in her father and mother; Hypertension in her father and mother; Liver cancer in her father. ? ?No Known Allergies ? ?  ? ?PHYSICAL EXAMINATION: ?Vital signs: BP 120/82   Pulse 74   Ht 5\' 4"  (1.626 m)   Wt 155 lb (70.3 kg)   SpO2 96%   BMI 26.61 kg/m?   ?Constitutional: generally well-appearing, no acute distress ?Psychiatric: alert and oriented x3, cooperative ?Eyes: extraocular movements intact, anicteric, conjunctiva pink ?Mouth: oral pharynx moist, no lesions ?Neck: supple no lymphadenopathy ?Cardiovascular: heart regular rate and rhythm, no murmur ?Lungs: clear to auscultation bilaterally ?Abdomen: soft, nontender, nondistended, no obvious ascites, no peritoneal signs, normal bowel sounds, no organomegaly ?Rectal: Omitted ?Extremities: no clubbing, cyanosis, or lower extremity edema bilaterally ?Skin: no lesions on visible extremities ?Neuro: No focal deficits.  Cranial nerves intact ? ?ASSESSMENT: ? ?1.  Chronic worsening problems with postprandial nausea with epigastric pain and intermittent vomiting.  Rule out GERD.  Rule out peptic ulcer disease.  Rule out gallbladder disease.  Rule out gastroparesis.  Rule out functional causes. ? ? ?PLAN: ? ?1.  Blood work today including CBC, comprehensive metabolic panel, C-reactive protein, and lipase ?2.  Schedule abdominal ultrasound.  Rule out gallstones. ?3.  Upper endoscopy.  Rule out ulcer disease.  Rule out GERD.The nature of the procedure, as well as the risks, benefits, and alternatives were carefully and thoroughly reviewed with the patient. Ample time for discussion and questions allowed. The patient understood, was satisfied, and agreed to proceed.  ?4.  Prescribe pantoprazole 40 mg daily.  Medication risk reviewed. ?A total time of 60 minutes was spent preparing to see the patient, obtaining comprehensive history, performing comprehensive physical examination, counseling and educating the patient regarding the above listed issues,  ordering laboratories, ordering advanced radiology study, and ordering endoscopic procedure.  Finally, documenting clinical information in the health record ? ? ? ?  ?

## 2021-04-06 NOTE — Patient Instructions (Signed)
If you are age 34 or older, your body mass index should be between 23-30. Your Body mass index is 26.61 kg/m?Marland Kitchen If this is out of the aforementioned range listed, please consider follow up with your Primary Care Provider. ? ?If you are age 37 or younger, your body mass index should be between 19-25. Your Body mass index is 26.61 kg/m?Marland Kitchen If this is out of the aformentioned range listed, please consider follow up with your Primary Care Provider.  ? ?________________________________________________________ ? ?The [facility] providers would like to encourage you to use [facility] to communicate with providers for non-urgent requests or questions.  Due to long hold times on the telephone, sending your provider a message by [facility] may be a faster and more efficient way to get a response.  Please allow 48 business hours for a response.  Please remember that this is for non-urgent requests.  ?_______________________________________________________ ? ?You will be contacted by [facility] Scheduling in the next 2 days to arrange a abdominal ultrasound.  The number on your caller ID will be [phone], please answer when they call.  If you have not heard from them in 2 days please call [phone] to schedule.   ? ?You have been scheduled for an endoscopy. Please follow written instructions given to you at your visit today. ?If you use inhalers (even only as needed), please bring them with you on the day of your procedure. ? ?

## 2021-04-07 ENCOUNTER — Ambulatory Visit (AMBULATORY_SURGERY_CENTER): Payer: BC Managed Care – PPO | Admitting: Internal Medicine

## 2021-04-07 ENCOUNTER — Telehealth (HOSPITAL_COMMUNITY): Payer: Self-pay | Admitting: Emergency Medicine

## 2021-04-07 ENCOUNTER — Other Ambulatory Visit: Payer: Self-pay

## 2021-04-07 ENCOUNTER — Encounter: Payer: Self-pay | Admitting: Internal Medicine

## 2021-04-07 VITALS — BP 109/66 | HR 60 | Temp 98.3°F | Resp 17 | Ht 64.0 in | Wt 155.0 lb

## 2021-04-07 DIAGNOSIS — R112 Nausea with vomiting, unspecified: Secondary | ICD-10-CM

## 2021-04-07 DIAGNOSIS — R1013 Epigastric pain: Secondary | ICD-10-CM | POA: Diagnosis present

## 2021-04-07 LAB — CULTURE, GROUP A STREP (THRC)

## 2021-04-07 MED ORDER — AMOXICILLIN 500 MG PO CAPS: 500.0000 mg | ORAL_CAPSULE | Freq: Two times a day (BID) | ORAL | 0 refills | Status: AC

## 2021-04-07 MED ORDER — SODIUM CHLORIDE 0.9 % IV SOLN: 500.0000 mL | Freq: Once | INTRAVENOUS | Status: DC

## 2021-04-07 NOTE — Progress Notes (Signed)
Report to PACU, RN, vss, BBS= Clear.  

## 2021-04-07 NOTE — Op Note (Signed)
Endoscopy Center ?Patient Name: Laura Glass ?Procedure Date: 04/07/2021 9:50 AM ?MRN: [id number] ?Endoscopist: Wilhemina Bonito. Marina Goodell , MD ?Age: 36 ?Referring MD:  ?Date of Birth: [birth year] ?Gender: Female ?Account #: [id number] ?Procedure:                Upper GI endoscopy ?Indications:              Epigastric abdominal pain, Nausea with vomiting ?Medicines:                Monitored Anesthesia Care ?Procedure:                Pre-Anesthesia Assessment: ?                          - Prior to the procedure, a History and Physical  ?                          was performed, and patient medications and  ?                          allergies were reviewed. The patient's tolerance of  ?                          previous anesthesia was also reviewed. The risks  ?                          and benefits of the procedure and the sedation  ?                          options and risks were discussed with the patient.  ?                          All questions were answered, and informed consent  ?                          was obtained. Prior Anticoagulants: The patient has  ?                          taken no previous anticoagulant or antiplatelet  ?                          agents. ASA Grade Assessment: I - A normal, healthy  ?                          patient. After reviewing the risks and benefits,  ?                          the patient was deemed in satisfactory condition to  ?                          undergo the procedure. ?                          After obtaining informed consent, the endoscope was  ?  passed under direct vision. Throughout the  ?                          procedure, the patient's blood pressure, pulse, and  ?                          oxygen saturations were monitored continuously. The  ?                          GIF [id number] #[id number] was introduced through the  ?                          mouth, and advanced to the second part of duodenum.  ?                          The upper GI  endoscopy was accomplished without  ?                          difficulty. The patient tolerated the procedure  ?                          well. ?Scope In: ?Scope Out: ?Findings:                 The esophagus was normal. ?                          The stomach was normal. ?                          The examined duodenum was normal. ?                          The cardia and gastric fundus were normal on  ?                          retroflexion. ?Complications:            No immediate complications. ?Estimated Blood Loss:     Estimated blood loss: none. ?Impression:               - Normal esophagus. ?                          - Normal stomach. ?                          - Normal examined duodenum. ?                          - No specimens collected. ?Recommendation:           - Patient has a contact number available for  ?                          emergencies. The signs and symptoms of potential  ?  delayed complications were discussed with the  ?                          patient. Return to normal activities tomorrow.  ?                          Written discharge instructions were provided to the  ?                          patient. ?                          - Resume previous diet. ?                          - Continue present medications. ?                          - Continue pantoprazole daily ?                          - Proceed with abdominal ultrasound ?                          - Office follow-up with Dr. Marina GoodellPerry in 4-6 weeks ?Wilhemina BonitoJohn N. Marina GoodellPerry, MD ?04/07/2021 10:03:00 AM ?This report has been signed electronically. ?

## 2021-04-07 NOTE — Patient Instructions (Signed)
Please read handouts provided. ?Continue present medications. ?Continue pantoprazole daily. ?Office follow-up with Dr. Marina Goodell in 4-6 weeks. ? ? ?YOU HAD AN ENDOSCOPIC PROCEDURE TODAY AT THE [facility]:   Refer to the procedure report that was given to you for any specific questions about what was found during the examination.  If the procedure report does not answer your questions, please call your gastroenterologist to clarify.  If you requested that your care partner not be given the details of your procedure findings, then the procedure report has been included in a sealed envelope for you to review at your convenience later. ? ?YOU SHOULD EXPECT: Some feelings of bloating in the abdomen. Passage of more gas than usual.  Walking can help get rid of the air that was put into your GI tract during the procedure and reduce the bloating. If you had a lower endoscopy (such as a colonoscopy or flexible sigmoidoscopy) you may notice spotting of blood in your stool or on the toilet paper. If you underwent a bowel prep for your procedure, you may not have a normal bowel movement for a few days. ? ?Please Note:  You might notice some irritation and congestion in your nose or some drainage.  This is from the oxygen used during your procedure.  There is no need for concern and it should clear up in a day or so. ? ?SYMPTOMS TO REPORT IMMEDIATELY: ? ? ? ?Following upper endoscopy (EGD) ? Vomiting of blood or coffee ground material ? New chest pain or pain under the shoulder blades ? Painful or persistently difficult swallowing ? New shortness of breath ? Fever of 100?F or higher ? Black, tarry-looking stools ? ?For urgent or emergent issues, a gastroenterologist can be reached at any hour by calling [phone]. ?Do not use MyChart messaging for urgent concerns.  ? ? ?DIET:  We do recommend a small meal at first, but then you may proceed to your regular diet.  Drink plenty of fluids but you should avoid  alcoholic beverages for 24 hours. ? ?ACTIVITY:  You should plan to take it easy for the rest of today and you should NOT DRIVE or use heavy machinery until tomorrow (because of the sedation medicines used during the test).   ? ?FOLLOW UP: ?Our staff will call the number listed on your records 48-72 hours following your procedure to check on you and address any questions or concerns that you may have regarding the information given to you following your procedure. If we do not reach you, we will leave a message.  We will attempt to reach you two times.  During this call, we will ask if you have developed any symptoms of COVID 19. If you develop any symptoms (ie: fever, flu-like symptoms, shortness of breath, cough etc.) before then, please call [phone].  If you test positive for Covid 19 in the 2 weeks post procedure, please call and report this information to [place].   ? ?If any biopsies were taken you will be contacted by phone or by letter within the next 1-3 weeks.  Please call us at [phone] if you have not heard about the biopsies in 3 weeks.  ? ? ?SIGNATURES/CONFIDENTIALITY: ?You and/or your care partner have signed paperwork which will be entered into your electronic medical record.  These signatures attest to the fact that that the information above on your After Visit Summary has been reviewed and is understood.  Full responsibility of the confidentiality of this discharge  information lies with you and/or your care-partner.  ?

## 2021-04-07 NOTE — Progress Notes (Signed)
GI PREPROCEDURE NOTE ? ?Patient was seen in the office yesterday for abdominal pain.  See the complete H&P.  For upper endoscopy ?

## 2021-04-09 ENCOUNTER — Telehealth: Payer: Self-pay

## 2021-04-09 ENCOUNTER — Telehealth: Payer: Self-pay | Admitting: *Deleted

## 2021-04-09 NOTE — Telephone Encounter (Signed)
Attempted to call patient for their post-procedure follow-up call. No answer. Left voicemail for patient to call back if she has any questions or concerns.  ? ?

## 2021-04-09 NOTE — Telephone Encounter (Signed)
No answer, left message to call back later today, B.Saurabh Hettich RN. 

## 2021-04-12 ENCOUNTER — Encounter: Payer: Self-pay | Admitting: Family

## 2021-04-16 ENCOUNTER — Telehealth: Payer: Self-pay | Admitting: Internal Medicine

## 2021-04-16 NOTE — Telephone Encounter (Signed)
I have advised that the order was entered by Julieanne Cotton at her office visit on 3/14.  I have sent that to the schedulers today incase they have not received the order. The pt was also provided with the phone number to call and inquire about appt.   ?

## 2021-04-16 NOTE — Telephone Encounter (Signed)
Patient called to schedule an ultrasound. Please advise ?

## 2021-04-30 ENCOUNTER — Ambulatory Visit (HOSPITAL_COMMUNITY)
Admission: RE | Admit: 2021-04-30 | Discharge: 2021-04-30 | Disposition: A | Discharge status: Home / Self Care | Payer: BC Managed Care – PPO | Source: Ambulatory Visit | Attending: Internal Medicine | Admitting: Internal Medicine

## 2021-04-30 DIAGNOSIS — R112 Nausea with vomiting, unspecified: Secondary | ICD-10-CM

## 2021-04-30 DIAGNOSIS — R1013 Epigastric pain: Secondary | ICD-10-CM | POA: Insufficient documentation

## 2021-05-03 ENCOUNTER — Other Ambulatory Visit: Payer: Self-pay

## 2021-05-03 DIAGNOSIS — R112 Nausea with vomiting, unspecified: Secondary | ICD-10-CM

## 2021-05-03 DIAGNOSIS — R1013 Epigastric pain: Secondary | ICD-10-CM

## 2021-05-10 ENCOUNTER — Encounter: Payer: Self-pay | Admitting: Internal Medicine

## 2021-05-10 ENCOUNTER — Ambulatory Visit: Payer: BC Managed Care – PPO | Admitting: Internal Medicine

## 2021-05-10 VITALS — BP 116/68 | HR 64 | Ht 64.0 in | Wt 158.0 lb

## 2021-05-10 DIAGNOSIS — R112 Nausea with vomiting, unspecified: Secondary | ICD-10-CM

## 2021-05-10 DIAGNOSIS — R1013 Epigastric pain: Secondary | ICD-10-CM | POA: Diagnosis not present

## 2021-05-10 NOTE — Patient Instructions (Signed)
If you are age 34 or older, your body mass index should be between 23-30. Your Body mass index is 27.12 kg/m?Marland Kitchen If this is out of the aforementioned range listed, please consider follow up with your Primary Care Provider. ? ?If you are age 52 or younger, your body mass index should be between 19-25. Your Body mass index is 27.12 kg/m?Marland Kitchen If this is out of the aformentioned range listed, please consider follow up with your Primary Care Provider.  ? ?________________________________________________________ ? ?The [facility] providers would like to encourage you to use [facility] to communicate with providers for non-urgent requests or questions.  Due to long hold times on the telephone, sending your provider a message by [facility] may be a faster and more efficient way to get a response.  Please allow 48 business hours for a response.  Please remember that this is for non-urgent requests.  ?_______________________________________________________ ? ?Take IBgard over the counter - one before meals ? ?Please follow up in three months ?

## 2021-05-10 NOTE — Progress Notes (Signed)
HISTORY OF PRESENT ILLNESS: ? ?Laura Glass is a 36 y.o. 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  See that dictation for details.  She underwent blood work including comprehensive metabolic panel and CBC.  This was normal.  On April 07, 2021 she underwent upper endoscopy.  This was normal.  She was continued on pantoprazole daily which was empirically prescribed.  She subsequently underwent abdominal ultrasound April 30, 2021.  Examination was normal save incidental diminutive gallbladder polyp.  She presents today for follow-up.  Patient tells me she has had no change in symptoms.  She continues with postprandial discomfort and nausea with rare vomiting episodes.  Her frequency of bowel movements is less, though this does not cause her any abdominal distress.  No bleeding.  I asked her about anxiety/depression as a possible contributor to her GI symptoms.  She does not think so.  She does see a psychiatrist who manages her behavioral health needs.  No new complaints. ? ?REVIEW OF SYSTEMS: ? ?All non-GI ROS negative unless otherwise stated in the HPI except for anxiety, sinus and allergy ? ?Past Medical History:  ?Diagnosis Date  ? Achilles tendonitis   ? Allergy   ? Anxiety   ? Depression   ? DVT (deep venous thrombosis) (HCC)   ? History of chicken pox   ? ? ?Past Surgical History:  ?Procedure Laterality Date  ? ACHILLES TENDON REPAIR Left 2021  ? ACHILLES TENDON SURGERY Left 12/27/2018  ? Procedure: LEFT ACHILLES DEBRIDEMENT + RECONSTRUCTION;  Surgeon: Terance Hart, MD;  Location: [facility];  Service: Orthopedics;  Laterality: Left;  TOTAL SURGERY REQUEST TIME: 1.5 HOURS  ? 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 Procedure: CALCANEAL EXOSTECTOMY;  Surgeon: Terance Hart, MD;  Location: [facility];   Service: Orthopedics;  Laterality: Left;  ? KNEE SURGERY Left 02/28/2017  ? WISDOM TOOTH EXTRACTION    ? ? ?Social History ?Laura Glass  reports that she has never smoked. She has never used smokeless tobacco. She reports current alcohol use. She reports that she does not use drugs. ? ?family history includes Cirrhosis in her father; Diabetes in her father and mother; Hypertension in her father and mother; Liver cancer in her father. ? ?No Known Allergies ? ?  ? ?PHYSICAL EXAMINATION: ?Vital signs: BP 116/68   Pulse 64   Ht 5\' 4"  (1.626 m)   Wt 158 lb (71.7 kg)   BMI 27.12 kg/m?   ?Constitutional: generally well-appearing, no acute distress ?Psychiatric: alert and oriented x3, cooperative ?Eyes: extraocular movements intact, anicteric, conjunctiva pink ?Mouth: oral pharynx moist, no lesions ?Neck: supple no lymphadenopathy ?Cardiovascular: heart regular rate and rhythm, no murmur ?Lungs: clear to auscultation bilaterally ?Abdomen: soft, nontender, nondistended, no obvious ascites, no peritoneal signs, normal bowel sounds, no organomegaly ?Rectal: Omitted ?Extremities: no clubbing, cyanosis, or lower extremity edema bilaterally ?Skin: no lesions on visible extremities ?Neuro: No focal deficits.  Cranial nerves intact ? ?ASSESSMENT: ? ?1.  Chronic abdominal discomfort with nausea and rare vomiting.  Ongoing.  Extensive negative work-up to date unrevealing.  Rule out gastroparesis.  Rule out IBS.  Rule out functional ? ? ?PLAN: ? ?1.  Solid-phase gastric emptying scan to rule out gastroparesis.  This has been scheduled for later this week.  We will contact the patient with the results when available ?2.  Recommended IBgard 1 before  meals ?3.  Routine GI office follow-up 3 months.  Contact the office in the interim for any questions or problems ?Total time of 30 minutes was spent preparing to see the patient, reviewing tests and x-rays, obtaining history, performing medically appropriate physical examination,  counseling the patient regarding the above listed issues, ordering advanced radiology study and recommending medical therapy.  Finally, documenting clinical information in the health record ? ? ? ? ?  ?

## 2021-05-14 ENCOUNTER — Ambulatory Visit (HOSPITAL_COMMUNITY)
Admission: RE | Admit: 2021-05-14 | Discharge: 2021-05-14 | Disposition: A | Discharge status: Home / Self Care | Payer: BC Managed Care – PPO | Source: Ambulatory Visit | Attending: Internal Medicine | Admitting: Internal Medicine

## 2021-05-14 DIAGNOSIS — R112 Nausea with vomiting, unspecified: Secondary | ICD-10-CM | POA: Diagnosis present

## 2021-05-14 DIAGNOSIS — R1013 Epigastric pain: Secondary | ICD-10-CM | POA: Insufficient documentation

## 2021-05-14 MED ORDER — TECHNETIUM TC 99M SULFUR COLLOID
2.2000 | Freq: Once | INTRAVENOUS | Status: AC | PRN
Start: 2021-05-14 — End: 2021-05-14
  Administered 2021-05-14: 2.2 via INTRAVENOUS

## 2021-06-09 ENCOUNTER — Ambulatory Visit (INDEPENDENT_AMBULATORY_CARE_PROVIDER_SITE_OTHER): Payer: BC Managed Care – PPO | Admitting: Family

## 2021-06-09 ENCOUNTER — Encounter: Payer: Self-pay | Admitting: Family

## 2021-06-09 VITALS — BP 118/70 | HR 73 | Temp 98.6°F | Resp 16 | Ht 64.0 in | Wt 160.0 lb

## 2021-06-09 DIAGNOSIS — E559 Vitamin D deficiency, unspecified: Secondary | ICD-10-CM | POA: Diagnosis not present

## 2021-06-09 DIAGNOSIS — J302 Other seasonal allergic rhinitis: Secondary | ICD-10-CM

## 2021-06-09 DIAGNOSIS — M7662 Achilles tendinitis, left leg: Secondary | ICD-10-CM | POA: Diagnosis not present

## 2021-06-09 DIAGNOSIS — F3131 Bipolar disorder, current episode depressed, mild: Secondary | ICD-10-CM | POA: Diagnosis not present

## 2021-06-09 MED ORDER — MONTELUKAST SODIUM 10 MG PO TABS: 10.0000 mg | ORAL_TABLET | Freq: Every day | ORAL | 3 refills | Status: DC

## 2021-06-09 MED ORDER — IBUPROFEN 800 MG PO TABS: 800.0000 mg | ORAL_TABLET | Freq: Three times a day (TID) | ORAL | 1 refills | Status: AC | PRN

## 2021-06-09 NOTE — Assessment & Plan Note (Addendum)
New plan ?Stop  Zyrtec, start xyzal ?Continue flonase 50 mcg ?rx singulair 10 mg (d/w pt black box warning, if causes anger/depression, stop and reach out to me) ?pataday over the counter eye drops ?

## 2021-06-09 NOTE — Assessment & Plan Note (Signed)
rx ibuprofen 800 mg prn ?

## 2021-06-09 NOTE — Progress Notes (Signed)
[facility] ? ? ?Established Patient Office Visit ? ?Subjective:  ?Patient ID: Laura Glass, female    DOB: 09-24-1985  Age: 36 y.o. MRN: [id number] ? ?CC:  ?Chief Complaint  ?Patient presents with  ? Transitions Of Care  ? ? ?HPI ?Laura Glass is here for a transition of care visit. ? ?Prior provider was: Nicki Reaper, FNP  ?Pt is without acute concerns.  ? ?Two achilles reconstructions in the past: curious if she can get 800 mg ibuprofen prn when she has tournaments. She plays and travels for softball.  ? ?Wanting to get control of her allergies. She is on flonase daily as well as zyrtec as well, and still with itchy watery eyes and runny nose. She wants to get something else to help control her medications as well. She is on otc eye drops.  ? ?Had her Rutha Bouchard taken out of her a few weeks ago, as planning to get pregnant come December.  ? ?GYN: 03/30/2017. Planning to get pregnant in December of this year, has gyn. Working with psychiatrist as well to work on medication shift. Last pap 2022, negative.  ? ?chronic concerns: ? ?Bipolar: unsure if type 2 or 1. On lamictal, trazodone and also cymbalta. Has three month f/u. Pt states doing well on this combination of meds.  ? ?  06/09/2021  ?  8:13 AM 03/30/2018  ?  2:34 PM  ?GAD 7 : Generalized Anxiety Score  ?Nervous, Anxious, on Edge 1 2  ?Control/stop worrying 0 2  ?Worry too much - different things 1 2  ?Trouble relaxing 0 2  ?Restless 0 0  ?Easily annoyed or irritable 1 1  ?Afraid - awful might happen 0 1  ?Total GAD 7 Score 3 10  ?Anxiety Difficulty Not difficult at all   ? ? ? ?  06/09/2021  ?  8:14 AM 10/29/2018  ?  9:42 AM 05/03/2018  ?  8:53 AM  ?PHQ9 SCORE ONLY  ?PHQ-9 Total Score 2 1 5   ? ? ? ?Vitamin d def: Last vitamin D ?Lab Results  ?Component Value Date  ? VD25OH 29.51 (L) 03/24/2017  ? ? ?Past Medical History:  ?Diagnosis Date  ? Achilles tendonitis   ? Allergy   ? Anxiety   ? Depression   ? DVT (deep venous thrombosis) (HCC)   ? History of chicken pox    ? ? ?Past Surgical History:  ?Procedure Laterality Date  ? ACHILLES TENDON REPAIR Left 2021  ? ACHILLES TENDON SURGERY Left 12/27/2018  ? Procedure: LEFT ACHILLES DEBRIDEMENT + RECONSTRUCTION;  Surgeon: 14/03/2018, MD;  Location: [facility];  Service: Orthopedics;  Laterality: Left;  TOTAL SURGERY REQUEST TIME: 1.5 HOURS  ? 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 Procedure: CALCANEAL EXOSTECTOMY;  Surgeon: 14/03/2018, MD;  Location: [facility];  Service: Orthopedics;  Laterality: Left;  ? KNEE SURGERY Left 02/28/2017  ? WISDOM TOOTH EXTRACTION    ? ? ?Family History  ?Problem Relation Age of Onset  ? Diabetes Mother   ? Hypertension Mother   ? Hypertension Father   ? Liver cancer Father   ? Cirrhosis Father   ?     NASH  ? Diabetes Father   ? Heart disease Father   ? Pancreatic cancer Neg Hx   ? Colon cancer Neg Hx   ? Esophageal cancer Neg Hx   ? Stomach cancer Neg Hx   ? ? ?Social History  ? ?Socioeconomic History  ? Marital status:  Significant Other  ?  Spouse name: Not on file  ? Number of children: 0  ? Years of education: Not on file  ? Highest education level: Not on file  ?Occupational History  ?  Employer: [employer]  ? Occupation: Runner, broadcasting/film/videoteacher , third  ?Tobacco Use  ? Smoking status: Never  ? Smokeless tobacco: Never  ?Vaping Use  ? Vaping Use: Never used  ?Substance and Sexual Activity  ? Alcohol use: Yes  ?  Comment: occasional  ? Drug use: No  ? Sexual activity: Yes  ?  Partners: Female  ?Other Topics Concern  ? Not on file  ?Social History Narrative  ? Not on file  ? ?Social Determinants of Health  ? ?Financial Resource Strain: Not on file  ?Food Insecurity: Not on file  ?Transportation Needs: Not on file  ?Physical Activity: Not on file  ?Stress: Not on file  ?Social Connections: Not on file  ?Intimate Partner Violence: Not on file  ? ? ?Outpatient Medications Prior to Visit  ?Medication Sig Dispense Refill  ? cetirizine (ZYRTEC) 10 MG  chewable tablet Chew 10 mg by mouth daily.    ? DULoxetine (CYMBALTA) 60 MG capsule Take 60 mg by mouth every morning.    ? fluticasone (FLONASE) 50 MCG/ACT nasal spray Place into both nostrils daily.    ? lamoTRIgine (LAMICTAL) 200 MG tablet Take 200 mg by mouth daily.    ? pantoprazole (PROTONIX) 40 MG tablet Take 1 tablet (40 mg total) by mouth daily. 90 tablet 3  ? traZODone (DESYREL) 50 MG tablet 150 mg at bedtime as needed for sleep.    ? levonorgestrel (KYLEENA) 19.5 MG IUD by Intrauterine route. 03/2017 (Patient not taking: Reported on 06/09/2021)    ? ?No facility-administered medications prior to visit.  ? ? ?No Known Allergies ? ?ROS ?Review of Systems  ?Constitutional:  Negative for chills and fatigue.  ?HENT:  Positive for congestion, postnasal drip, rhinorrhea and sneezing. Negative for ear pain, sinus pressure, sinus pain and sore throat.   ?Eyes:  Positive for itching. Negative for pain, discharge, redness and visual disturbance.  ?Respiratory:  Positive for cough (dry). Negative for shortness of breath.   ?Cardiovascular:  Negative for chest pain and leg swelling.  ?Gastrointestinal:  Negative for diarrhea and nausea.  ?Genitourinary:  Negative for difficulty urinating, menstrual problem and vaginal bleeding.  ?Musculoskeletal:  Positive for arthralgias (lower leg pain, heels, at times).  ?Neurological:  Negative for dizziness and headaches.  ?Psychiatric/Behavioral:  Negative for agitation and sleep disturbance.   ?All other systems reviewed and are negative. ? ? ? ?  ?Objective:  ?  ?Physical Exam ?Vitals reviewed.  ?Constitutional:   ?   General: She is not in acute distress. ?   Appearance: Normal appearance. She is not ill-appearing or toxic-appearing.  ?HENT:  ?   Right Ear: Tympanic membrane normal.  ?   Left Ear: Tympanic membrane normal.  ?   Mouth/Throat:  ?   Mouth: Mucous membranes are moist.  ?   Pharynx: Posterior oropharyngeal erythema present. No pharyngeal swelling.  ?   Tonsils: No  tonsillar exudate.  ?   Comments: Cobblestoning of the throat ?Eyes:  ?   Extraocular Movements: Extraocular movements intact.  ?   Conjunctiva/sclera: Conjunctivae normal.  ?   Pupils: Pupils are equal, round, and reactive to light.  ?Neck:  ?   Thyroid: No thyroid mass.  ?Cardiovascular:  ?   Rate and Rhythm: Normal rate and regular  rhythm.  ?Pulmonary:  ?   Effort: Pulmonary effort is normal.  ?   Breath sounds: Normal breath sounds.  ?Abdominal:  ?   General: Abdomen is flat. Bowel sounds are normal.  ?   Palpations: Abdomen is soft.  ?Musculoskeletal:     ?   General: Normal range of motion.  ?Lymphadenopathy:  ?   Cervical:  ?   Right cervical: No superficial cervical adenopathy. ?   Left cervical: No superficial cervical adenopathy.  ?Skin: ?   General: Skin is warm.  ?   Capillary Refill: Capillary refill takes less than 2 seconds.  ?Neurological:  ?   General: No focal deficit present.  ?   Mental Status: She is alert and oriented to person, place, and time.  ?Psychiatric:     ?   Mood and Affect: Mood normal.     ?   Behavior: Behavior normal.     ?   Thought Content: Thought content normal.     ?   Judgment: Judgment normal.  ? ? ? ?BP 118/70   Pulse 73   Temp 98.6 ?F (37 ?C)   Resp 16   Ht 5\' 4"  (1.626 m)   Wt 160 lb (72.6 kg)   SpO2 98%   BMI 27.46 kg/m?  ?Wt Readings from Last 3 Encounters:  ?06/09/21 160 lb (72.6 kg)  ?05/10/21 158 lb (71.7 kg)  ?04/07/21 155 lb (70.3 kg)  ? ? ? ?There are no preventive care reminders to display for this patient. ? ? ?There are no preventive care reminders to display for this patient. ? ?Lab Results  ?Component Value Date  ? TSH 1.82 03/30/2018  ? ?Lab Results  ?Component Value Date  ? WBC 10.8 (H) 04/06/2021  ? HGB 14.2 04/06/2021  ? HCT 41.6 04/06/2021  ? MCV 89.3 04/06/2021  ? PLT 233.0 04/06/2021  ? ?Lab Results  ?Component Value Date  ? NA 138 04/06/2021  ? K 3.8 04/06/2021  ? CO2 28 04/06/2021  ? GLUCOSE 72 04/06/2021  ? BUN 13 04/06/2021  ? CREATININE  0.81 04/06/2021  ? BILITOT 0.3 04/06/2021  ? ALKPHOS 53 04/06/2021  ? AST 19 04/06/2021  ? ALT 26 04/06/2021  ? PROT 7.4 04/06/2021  ? ALBUMIN 4.8 04/06/2021  ? CALCIUM 9.8 04/06/2021  ? GFR 95.41 04/06/2021  ? ?No re

## 2021-06-09 NOTE — Assessment & Plan Note (Signed)
Continue f/u with psychiatrist as scheduled ?Continue current medication regimen ? ?

## 2021-06-09 NOTE — Assessment & Plan Note (Signed)
Reviewed last vitamin D, recommend daily vitamin D3 2000 IU  ?Will check labs again at six month f/u ?

## 2021-06-09 NOTE — Patient Instructions (Addendum)
Recommend switching to xyzal, but take at night time as can make you tired ?Continue flonase.  ?Recommend over the counter pataday allergy eye drops.  ?Will add RX for singulair, sending in for you.  ? ?Take vitamin D3 2000 IU once daily.  ? ?Welcome to our clinic, I am happy to have you as my new patient. I am excited to continue on this healthcare journey with you. ? ?Stop by the lab prior to leaving today. I will notify you of your results once received.  ? ?Please keep in mind ?Any my chart messages you send have p to a three business day turnaround for a response.  ?Phone calls may have up to a one day business turnaround for a  response.  ? ?If you need a medication refill I recommend you request it through the pharmacy as this is easiest for [place] rather than sending a message and or phone call.  ? ?Due to recent changes in healthcare laws, you may see results of your imaging and/or laboratory studies on MyChart before I have had a chance to review them.  I understand that in some cases there may be results that are confusing or concerning to you. Please understand that not all results are received at the same time and often I may need to interpret multiple results in order to provide you with the best plan of care or course of treatment. Therefore, I ask that you please give me 2 business days to thoroughly review all your results before contacting my office for clarification. Should we see a critical lab result, you will be contacted sooner.  ? ?It was a pleasure seeing you today! Please do not hesitate to reach out with any questions and or concerns. ? ?Regards,  ? ?Octavian Godek ?FNP-C ? ? ? ? ? ? ? ?

## 2021-08-16 ENCOUNTER — Encounter: Payer: Self-pay | Admitting: Internal Medicine

## 2021-09-06 ENCOUNTER — Other Ambulatory Visit: Payer: Self-pay | Admitting: Family

## 2021-09-06 DIAGNOSIS — J302 Other seasonal allergic rhinitis: Secondary | ICD-10-CM

## 2021-10-07 ENCOUNTER — Encounter: Payer: Self-pay | Admitting: Family

## 2021-10-08 ENCOUNTER — Telehealth (INDEPENDENT_AMBULATORY_CARE_PROVIDER_SITE_OTHER): Payer: BC Managed Care – PPO | Admitting: Nurse Practitioner

## 2021-10-08 DIAGNOSIS — U071 COVID-19: Secondary | ICD-10-CM | POA: Diagnosis not present

## 2021-10-08 MED ORDER — NIRMATRELVIR/RITONAVIR (PAXLOVID)TABLET: 3.0000 | ORAL_TABLET | Freq: Two times a day (BID) | ORAL | 0 refills | Status: AC

## 2021-10-08 NOTE — Progress Notes (Signed)
Patient ID: Laura Glass, female    DOB: 23-Feb-1985, 36 y.o.   MRN: [id number]  Virtual visit completed through [employer], a video enabled telemedicine application. Due to national recommendations of social distancing due to COVID-19, a virtual visit is felt to be most appropriate for this patient at this time. Reviewed limitations, risks, security and privacy concerns of performing a virtual visit and the availability of in person appointments. I also reviewed that there may be a patient responsible charge related to this service. The patient agreed to proceed.   Patient location: home Provider location: [place] at [facility], office Persons participating in this virtual visit: patient, provider   If any vitals were documented, they were collected by patient at home unless specified below.    LMP 10/05/2021    CC: Covid 19 Subjective:   HPI: Laura Glass is a 36 y.o. female presenting on 10/08/2021 for Covid Positive (On 10/07/21, sx started on 10/05/21-body ache, sore throat , congestion, headache.)    Symptoms started on 10/05/2021 Patient taken at home COVID test yesterday 10/07/2021 that was positive. No covid vaccine [employer] grade teacher Dayquill and nyquill over-the-counter without great relief.   Relevant past medical, surgical, family and social history reviewed and updated as indicated. Interim medical history since our last visit reviewed. Allergies and medications reviewed and updated. Outpatient Medications Prior to Visit  Medication Sig Dispense Refill   cetirizine (ZYRTEC) 10 MG chewable tablet Chew 10 mg by mouth daily.     fluticasone (FLONASE) 50 MCG/ACT nasal spray Place into both nostrils daily.     montelukast (SINGULAIR) 10 MG tablet TAKE 1 TABLET BY MOUTH EVERYDAY AT BEDTIME 90 tablet 3   pantoprazole (PROTONIX) 40 MG tablet Take 1 tablet (40 mg total) by mouth daily. 90 tablet 3   traZODone (DESYREL) 50 MG tablet 150 mg at bedtime as  needed for sleep.     DULoxetine (CYMBALTA) 60 MG capsule Take 60 mg by mouth every morning.     lamoTRIgine (LAMICTAL) 200 MG tablet Take 200 mg by mouth daily.     No facility-administered medications prior to visit.     Per HPI unless specifically indicated in ROS section below Review of Systems  Constitutional:  Positive for appetite change, chills, fatigue and fever.  HENT:  Positive for congestion, ear pain (intermittent), sinus pressure, sinus pain and sore throat. Negative for ear discharge.   Respiratory:  Positive for cough. Negative for shortness of breath.   Cardiovascular:  Negative for chest pain.  Gastrointestinal:  Negative for abdominal pain, diarrhea and nausea.  Neurological:  Positive for headaches.   Objective:  LMP 10/05/2021   Wt Readings from Last 3 Encounters:  06/09/21 160 lb (72.6 kg)  05/10/21 158 lb (71.7 kg)  04/07/21 155 lb (70.3 kg)       Physical exam: Gen: alert, NAD, not ill appearing Pulm: speaks in complete sentences without increased work of breathing Psych: normal mood, normal thought content      Results for orders placed or performed in visit on 04/06/21  Lipase  Result Value Ref Range   Lipase 14.0 11.0 - 59.0 U/L  High sensitivity CRP  Result Value Ref Range   CRP, High Sensitivity 4.370 0.000 - 5.000 mg/L  Comprehensive metabolic panel  Result Value Ref Range   Sodium 138 135 - 145 mEq/L   Potassium 3.8 3.5 - 5.1 mEq/L   Chloride 103 96 - 112 mEq/L   CO2 28 19 -  32 mEq/L   Glucose, Bld 72 70 - 99 mg/dL   BUN 13 6 - 23 mg/dL   Creatinine, Ser 3.97 0.40 - 1.20 mg/dL   Total Bilirubin 0.3 0.2 - 1.2 mg/dL   Alkaline Phosphatase 53 39 - 117 U/L   AST 19 0 - 37 U/L   ALT 26 0 - 35 U/L   Total Protein 7.4 6.0 - 8.3 g/dL   Albumin 4.8 3.5 - 5.2 g/dL   GFR 67.34 >19.37 mL/min   Calcium 9.8 8.4 - 10.5 mg/dL  CBC with Differential/Platelet  Result Value Ref Range   WBC 10.8 (H) 4.0 - 10.5 K/uL   RBC 4.66 3.87 - 5.11 Mil/uL    Hemoglobin 14.2 12.0 - 15.0 g/dL   HCT 90.2 40.9 - 73.5 %   MCV 89.3 78.0 - 100.0 fl   MCHC 34.0 30.0 - 36.0 g/dL   RDW 32.9 92.4 - 26.8 %   Platelets 233.0 150.0 - 400.0 K/uL   Neutrophils Relative % 79.1 (H) 43.0 - 77.0 %   Lymphocytes Relative 13.7 12.0 - 46.0 %   Monocytes Relative 6.2 3.0 - 12.0 %   Eosinophils Relative 0.8 0.0 - 5.0 %   Basophils Relative 0.2 0.0 - 3.0 %   Neutro Abs 8.6 (H) 1.4 - 7.7 K/uL   Lymphs Abs 1.5 0.7 - 4.0 K/uL   Monocytes Absolute 0.7 0.1 - 1.0 K/uL   Eosinophils Absolute 0.1 0.0 - 0.7 K/uL   Basophils Absolute 0.0 0.0 - 0.1 K/uL   Assessment & Plan:   Problem List Items Addressed This Visit       Other   COVID-19 - Primary    Patient will hold flonase while on the paxlovid medication   Since patient is unvaccinated we did discuss antiviral treatments.  Did discuss that current antiviral treatments are still under EUA only.  After joint discussion did decide to pursue Paxlovid.  Patient will start Paxlovid soon as possible.  Did discuss common side effects inclusive of nausea and altered taste in mouth.  Also discussed CDC guidelines/recommendations in regards to self quarantine/isolation.  Signs and symptoms discussed when to seek urgent emergent healthcare.  Follow-up if no improvement.      Relevant Medications   nirmatrelvir/ritonavir EUA (PAXLOVID) 20 x 150 MG & 10 x 100MG  TABS     Meds ordered this encounter  Medications   nirmatrelvir/ritonavir EUA (PAXLOVID) 20 x 150 MG & 10 x 100MG  TABS    Sig: Take 3 tablets by mouth 2 (two) times daily for 5 days. (Take nirmatrelvir 150 mg two tablets twice daily for 5 days and ritonavir 100 mg one tablet twice daily for 5 days) Patient GFR is 95.41    Dispense:  30 tablet    Refill:  0    Order Specific Question:   Supervising Provider    Answer:   TOWER, MARNE A [[id number]]   No orders of the defined types were placed in this encounter.   I discussed the assessment and treatment plan with the  patient. The patient was provided an opportunity to ask questions and all were answered. The patient agreed with the plan and demonstrated an understanding of the instructions. The patient was advised to call back or seek an in-person evaluation if the symptoms worsen or if the condition fails to improve as anticipated.  Follow up plan: Return if symptoms worsen or fail to improve.  , NP

## 2021-10-08 NOTE — Assessment & Plan Note (Addendum)
Patient will hold flonase while on the paxlovid medication   Since patient is unvaccinated we did discuss antiviral treatments.  Did discuss that current antiviral treatments are still under EUA only.  After joint discussion did decide to pursue Paxlovid.  Patient will start Paxlovid soon as possible.  Did discuss common side effects inclusive of nausea and altered taste in mouth.  Also discussed CDC guidelines/recommendations in regards to self quarantine/isolation.  Signs and symptoms discussed when to seek urgent emergent healthcare.  Follow-up if no improvement.

## 2022-01-25 ENCOUNTER — Other Ambulatory Visit: Payer: Self-pay | Admitting: Family

## 2022-01-25 DIAGNOSIS — M7662 Achilles tendinitis, left leg: Secondary | ICD-10-CM

## 2022-01-26 ENCOUNTER — Encounter: Payer: Self-pay | Admitting: Family

## 2022-01-26 DIAGNOSIS — G479 Sleep disorder, unspecified: Secondary | ICD-10-CM

## 2022-01-26 MED ORDER — TRAZODONE HCL 150 MG PO TABS: ORAL_TABLET | ORAL | 0 refills | Status: DC

## 2022-02-19 ENCOUNTER — Other Ambulatory Visit: Payer: Self-pay | Admitting: Family

## 2022-02-19 DIAGNOSIS — G479 Sleep disorder, unspecified: Secondary | ICD-10-CM

## 2022-02-23 ENCOUNTER — Ambulatory Visit
Admission: EM | Admit: 2022-02-23 | Discharge: 2022-02-23 | Disposition: A | Discharge status: Home / Self Care | Payer: BC Managed Care – PPO | Attending: Internal Medicine | Admitting: Internal Medicine

## 2022-02-23 DIAGNOSIS — Z1152 Encounter for screening for COVID-19: Secondary | ICD-10-CM | POA: Insufficient documentation

## 2022-02-23 DIAGNOSIS — J069 Acute upper respiratory infection, unspecified: Secondary | ICD-10-CM

## 2022-02-23 DIAGNOSIS — J029 Acute pharyngitis, unspecified: Secondary | ICD-10-CM | POA: Diagnosis present

## 2022-02-23 DIAGNOSIS — H109 Unspecified conjunctivitis: Secondary | ICD-10-CM

## 2022-02-23 LAB — POCT RAPID STREP A (OFFICE): Rapid Strep A Screen: NEGATIVE

## 2022-02-23 MED ORDER — BENZONATATE 100 MG PO CAPS: 100.0000 mg | ORAL_CAPSULE | Freq: Three times a day (TID) | ORAL | 0 refills | Status: DC | PRN

## 2022-02-23 MED ORDER — POLYMYXIN B-TRIMETHOPRIM 10000-0.1 UNIT/ML-% OP SOLN: 1.0000 [drp] | Freq: Four times a day (QID) | OPHTHALMIC | 0 refills | Status: AC

## 2022-02-23 NOTE — Discharge Instructions (Signed)
Strep is negative.  Throat culture and COVID test pending.  Will call if it is positive.  I have prescribed you a cough medication and medication to pink eye.  Please change pillowcase and linen daily.  Follow-up if symptoms persist or worsen.

## 2022-02-23 NOTE — ED Triage Notes (Signed)
Sore throat, cough, sneezing, that started Sunday. Patient is having redness and pain in right eye, woke up with eye matted with yellow crust. Taking Dyquil, Nyquil.

## 2022-02-23 NOTE — ED Provider Notes (Signed)
[facility]    CSN: [id number] Arrival date & time: 02/23/22  1544      History   Chief Complaint Chief Complaint  Patient presents with   Sore Throat   Conjunctivitis    HPI Laura Glass is a 37 y.o. female.   Patient presents with nasal congestion, sore throat, cough, sneezing that started about 4 days ago.  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  Denies any fever at home.  Patient reports that she woke up this morning with her eye crusted shut and eye irritation and redness.  Denies trauma or foreign body to the eye.  She does not wear contacts.  Wears glasses as needed.  Denies any vision changes.  Denies history of asthma and patient does not smoke cigarettes.  Patient has taken DayQuil and NyQuil with minimal improvement in symptoms.  Denies chest pain or shortness of breath.   Sore Throat  Conjunctivitis    Past Medical History:  Diagnosis Date   Achilles tendonitis    Allergy    Anxiety    Depression    DVT (deep venous thrombosis) (HCC)    History of chicken pox     Patient Active Problem List   Diagnosis Date Noted   COVID-19 10/08/2021   Bipolar affective disorder, currently depressed, mild ([place]) 06/09/2021   Vitamin D deficiency 06/09/2021   Left Achilles tendinitis 06/09/2021   Seasonal allergies 09/07/2015    Past Surgical History:  Procedure Laterality Date   ACHILLES TENDON REPAIR Left 2021   ACHILLES TENDON SURGERY Left 12/27/2018   Procedure: LEFT ACHILLES DEBRIDEMENT + RECONSTRUCTION;  Surgeon: Erle Crocker, MD;  Location: [facility];  Service: Orthopedics;  Laterality: Left;  TOTAL SURGERY REQUEST TIME: 1.5 HOURS   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      Home Medications    Prior to Admission medications   Medication Sig Start Date End Date Taking? Authorizing Provider  benzonatate (TESSALON) 100 MG capsule Take 1 capsule (100 mg total) by mouth every 8 (eight) hours as needed for cough. 02/23/22  Yes Nasire Reali, Michele Rockers, FNP  cetirizine (ZYRTEC) 10 MG chewable tablet Chew 10 mg by mouth daily.   Yes [provider]  fluticasone (FLONASE) 50 MCG/ACT nasal spray Place into both nostrils daily.   Yes [provider]  pantoprazole (PROTONIX) 40 MG tablet Take 1 tablet (40 mg total) by mouth daily. 04/06/21  Yes Irene Shipper, MD  traZODone (DESYREL) 150 MG tablet TAKE 1 TABLET AT BEDTIME FOR INSOMNIA 02/21/22  Yes Dugal, Tabitha, FNP  trimethoprim-polymyxin b (POLYTRIM) ophthalmic solution Place 1 drop into the right eye every 6 (six) hours for 5 days. 02/23/22 02/28/22 Yes Shakeerah Gradel, Michele Rockers, FNP  montelukast (SINGULAIR) 10 MG tablet TAKE 1 TABLET BY MOUTH EVERYDAY AT BEDTIME 09/07/21   Eugenia Pancoast, FNP    Family History Family History  Problem Relation Age of Onset   Diabetes Mother    Hypertension Mother    Hypertension Father    Liver cancer Father    Cirrhosis Father        NASH   Diabetes Father    Heart  disease Father    Pancreatic cancer Neg Hx    Colon cancer Neg Hx    Esophageal cancer Neg Hx    Stomach cancer Neg Hx     Social History Social History   Tobacco Use   Smoking status: Never   Smokeless tobacco: Never  Vaping Use   Vaping Use: Never used  Substance Use Topics   Alcohol use: Yes    Comment: occasional   Drug use: No     Allergies   Patient has no known allergies.   Review of Systems Review of Systems Per HPI  Physical Exam Triage Vital Signs ED Triage Vitals  Enc Vitals Group     BP 02/23/22 1615 115/70     Pulse Rate 02/23/22 1615 81     Resp 02/23/22 1615 16     Temp 02/23/22 1615 98.2 F (36.8 C)      Temp Source 02/23/22 1615 Oral     SpO2 02/23/22 1615 96 %     Weight --      Height --      Head Circumference --      Peak Flow --      Pain Score 02/23/22 1618 7     Pain Loc --      Pain Edu? --      Excl. in [place]? --    No data found.  Updated Vital Signs BP 115/70 (BP Location: Right Arm)   Pulse 81   Temp 98.2 F (36.8 C) (Oral)   Resp 16   LMP 02/12/2022 (Exact Date)   SpO2 96%   Visual Acuity Right Eye Distance:   Left Eye Distance:   Bilateral Distance:    Right Eye Near:   Left Eye Near:    Bilateral Near:     Physical Exam Constitutional:      General: She is not in acute distress.    Appearance: Normal appearance. She is not toxic-appearing or diaphoretic.  HENT:     Head: Normocephalic and atraumatic.     Right Ear: Tympanic membrane and ear canal normal.     Left Ear: Tympanic membrane and ear canal normal.     Nose: Congestion present.     Mouth/Throat:     Mouth: Mucous membranes are moist.     Pharynx: Posterior oropharyngeal erythema present.  Eyes:     General: Lids are normal. Lids are everted, no foreign bodies appreciated. Vision grossly intact. Gaze aligned appropriately.     Extraocular Movements: Extraocular movements intact.     Conjunctiva/sclera:     Right eye: Right conjunctiva is injected. Chemosis present. No exudate or hemorrhage.    Left eye: Left conjunctiva is not injected. No chemosis, exudate or hemorrhage.    Pupils: Pupils are equal, round, and reactive to light.  Cardiovascular:     Rate and Rhythm: Normal rate and regular rhythm.     Pulses: Normal pulses.     Heart sounds: Normal heart sounds.  Pulmonary:     Effort: Pulmonary effort is normal. No respiratory distress.     Breath sounds: Normal breath sounds. No stridor. No wheezing, rhonchi or rales.  Abdominal:     General: Abdomen is flat. Bowel sounds are normal.     Palpations: Abdomen is soft.  Musculoskeletal:        General: Normal range of motion.      Cervical back: Normal range of motion.  Skin:    General: Skin is warm and dry.  Neurological:     General: No focal deficit present.     Mental Status: She is alert and oriented to person, place, and time. Mental status is at baseline.  Psychiatric:        Mood and Affect: Mood normal.        Behavior: Behavior normal.      UC Treatments / Results  Labs (all labs ordered are listed, but only abnormal results are displayed) Labs Reviewed  CULTURE, GROUP A STREP ([place])  SARS CORONAVIRUS 2 (TAT 6-24 HRS)  POCT RAPID STREP A (OFFICE)    EKG   Radiology No results found.  Procedures Procedures (including critical care time)  Medications Ordered in UC Medications - No data to display  Initial Impression / Assessment and Plan / UC Course  I have reviewed the triage vital signs and the nursing notes.  Pertinent labs & imaging results that were available during my care of the patient were reviewed by me and considered in my medical decision making (see chart for details).     Patient presents with symptoms likely from a viral upper respiratory infection. Do not suspect underlying cardiopulmonary process. Symptoms seem unlikely related to ACS, CHF or COPD exacerbations, pneumonia, pneumothorax. Patient is nontoxic appearing and not in need of emergent medical intervention.  Rapid strep was negative.  Throat culture and COVID test pending.  Recommended symptom control with medications and supportive care.  Patient sent cough medication.  Polytrim antibiotic eyedrops for right bacterial conjunctivitis.  Visual acuity appears normal.  Return if symptoms fail to improve in 1-2 weeks or you develop shortness of breath, chest pain, severe headache. Patient states understanding and is agreeable.  Discharged with PCP followup.  Final Clinical Impressions(s) / UC Diagnoses   Final diagnoses:  Viral upper respiratory tract infection with cough  Sore throat  Bacterial conjunctivitis  of right eye     Discharge Instructions      Strep is negative.  Throat culture and COVID test pending.  Will call if it is positive.  I have prescribed you a cough medication and medication to pink eye.  Please change pillowcase and linen daily.  Follow-up if symptoms persist or worsen.     ED Prescriptions     Medication Sig Dispense Auth. Provider   trimethoprim-polymyxin b (POLYTRIM) ophthalmic solution Place 1 drop into the right eye every 6 (six) hours for 5 days. 10 mL Audric Venn, Hildred Alamin E, [place]   benzonatate (TESSALON) 100 MG capsule Take 1 capsule (100 mg total) by mouth every 8 (eight) hours as needed for cough. 21 capsule [place], Michele Rockers, [place]      PDMP not reviewed this encounter.   Teodora Medici, [place] 02/23/22 [phone]

## 2022-02-24 LAB — SARS CORONAVIRUS 2 (TAT 6-24 HRS): SARS Coronavirus 2: NEGATIVE

## 2022-02-26 LAB — CULTURE, GROUP A STREP (THRC)

## 2022-04-11 ENCOUNTER — Other Ambulatory Visit: Payer: Self-pay | Admitting: Internal Medicine

## 2022-08-17 LAB — OB RESULTS CONSOLE GC/CHLAMYDIA
Chlamydia: NEGATIVE
Neisseria Gonorrhea: NEGATIVE

## 2022-08-21 ENCOUNTER — Other Ambulatory Visit: Payer: Self-pay | Admitting: Family

## 2022-08-21 DIAGNOSIS — G479 Sleep disorder, unspecified: Secondary | ICD-10-CM

## 2022-09-16 LAB — OB RESULTS CONSOLE ABO/RH: RH Type: POSITIVE

## 2022-09-16 LAB — OB RESULTS CONSOLE ANTIBODY SCREEN: Antibody Screen: NEGATIVE

## 2022-09-16 LAB — OB RESULTS CONSOLE RUBELLA ANTIBODY, IGM: Rubella: IMMUNE

## 2022-09-16 LAB — HEPATITIS C ANTIBODY: HCV Ab: NEGATIVE

## 2022-09-16 LAB — OB RESULTS CONSOLE HEPATITIS B SURFACE ANTIGEN: Hepatitis B Surface Ag: NEGATIVE

## 2022-09-16 LAB — OB RESULTS CONSOLE HIV ANTIBODY (ROUTINE TESTING): HIV: NONREACTIVE

## 2022-09-16 LAB — OB RESULTS CONSOLE RPR: RPR: NONREACTIVE

## 2022-11-04 ENCOUNTER — Other Ambulatory Visit: Payer: Self-pay | Admitting: Family

## 2022-11-04 DIAGNOSIS — J302 Other seasonal allergic rhinitis: Secondary | ICD-10-CM

## 2023-03-14 LAB — OB RESULTS CONSOLE GBS: GBS: POSITIVE

## 2023-03-22 ENCOUNTER — Telehealth (HOSPITAL_COMMUNITY): Payer: Self-pay | Admitting: *Deleted

## 2023-03-22 ENCOUNTER — Encounter (HOSPITAL_COMMUNITY): Payer: Self-pay | Admitting: *Deleted

## 2023-03-22 NOTE — Telephone Encounter (Signed)
 Preadmission screen

## 2023-03-23 ENCOUNTER — Encounter (HOSPITAL_COMMUNITY): Payer: Self-pay

## 2023-03-27 ENCOUNTER — Encounter (HOSPITAL_COMMUNITY): Payer: Self-pay | Admitting: *Deleted

## 2023-03-27 ENCOUNTER — Telehealth (HOSPITAL_COMMUNITY): Payer: Self-pay | Admitting: *Deleted

## 2023-03-27 NOTE — Telephone Encounter (Signed)
 Preadmission screen

## 2023-04-04 ENCOUNTER — Encounter (HOSPITAL_COMMUNITY)
Admission: RE | Disposition: A | Discharge status: Home / Self Care | Payer: Self-pay | Source: Home / Self Care | Attending: Obstetrics and Gynecology

## 2023-04-04 ENCOUNTER — Encounter (HOSPITAL_COMMUNITY): Payer: Self-pay | Admitting: Obstetrics and Gynecology

## 2023-04-04 ENCOUNTER — Other Ambulatory Visit: Payer: Self-pay

## 2023-04-04 ENCOUNTER — Inpatient Hospital Stay (HOSPITAL_COMMUNITY): Payer: 59

## 2023-04-04 ENCOUNTER — Inpatient Hospital Stay (HOSPITAL_COMMUNITY)
Admission: RE | Admit: 2023-04-04 | Discharge: 2023-04-07 | DRG: 787 | Disposition: A | Discharge status: Home / Self Care | Payer: 59 | Attending: Obstetrics and Gynecology | Admitting: Obstetrics and Gynecology

## 2023-04-04 ENCOUNTER — Inpatient Hospital Stay (HOSPITAL_COMMUNITY): Admitting: Anesthesiology

## 2023-04-04 DIAGNOSIS — Z3A00 Weeks of gestation of pregnancy not specified: Secondary | ICD-10-CM

## 2023-04-04 DIAGNOSIS — D62 Acute posthemorrhagic anemia: Secondary | ICD-10-CM | POA: Diagnosis not present

## 2023-04-04 DIAGNOSIS — Z3A39 39 weeks gestation of pregnancy: Secondary | ICD-10-CM

## 2023-04-04 DIAGNOSIS — O09523 Supervision of elderly multigravida, third trimester: Secondary | ICD-10-CM

## 2023-04-04 DIAGNOSIS — F319 Bipolar disorder, unspecified: Secondary | ICD-10-CM | POA: Diagnosis present

## 2023-04-04 DIAGNOSIS — Z86718 Personal history of other venous thrombosis and embolism: Secondary | ICD-10-CM | POA: Diagnosis not present

## 2023-04-04 DIAGNOSIS — Z3493 Encounter for supervision of normal pregnancy, unspecified, third trimester: Principal | ICD-10-CM

## 2023-04-04 DIAGNOSIS — Z833 Family history of diabetes mellitus: Secondary | ICD-10-CM | POA: Diagnosis not present

## 2023-04-04 DIAGNOSIS — O99820 Streptococcus B carrier state complicating pregnancy: Secondary | ICD-10-CM | POA: Diagnosis not present

## 2023-04-04 DIAGNOSIS — O99824 Streptococcus B carrier state complicating childbirth: Secondary | ICD-10-CM | POA: Diagnosis present

## 2023-04-04 DIAGNOSIS — O99344 Other mental disorders complicating childbirth: Secondary | ICD-10-CM | POA: Diagnosis present

## 2023-04-04 DIAGNOSIS — Z8249 Family history of ischemic heart disease and other diseases of the circulatory system: Secondary | ICD-10-CM | POA: Diagnosis not present

## 2023-04-04 DIAGNOSIS — O34219 Maternal care for unspecified type scar from previous cesarean delivery: Secondary | ICD-10-CM | POA: Diagnosis not present

## 2023-04-04 DIAGNOSIS — O134 Gestational [pregnancy-induced] hypertension without significant proteinuria, complicating childbirth: Secondary | ICD-10-CM

## 2023-04-04 DIAGNOSIS — O26893 Other specified pregnancy related conditions, third trimester: Principal | ICD-10-CM | POA: Diagnosis present

## 2023-04-04 DIAGNOSIS — O9081 Anemia of the puerperium: Secondary | ICD-10-CM | POA: Diagnosis not present

## 2023-04-04 LAB — CBC
HCT: 37.7 % (ref 36.0–46.0)
HCT: 39.2 % (ref 36.0–46.0)
Hemoglobin: 12.8 g/dL (ref 12.0–15.0)
Hemoglobin: 13.5 g/dL (ref 12.0–15.0)
MCH: 30.1 pg (ref 26.0–34.0)
MCH: 30.3 pg (ref 26.0–34.0)
MCHC: 34 g/dL (ref 30.0–36.0)
MCHC: 34.4 g/dL (ref 30.0–36.0)
MCV: 88.7 fL (ref 80.0–100.0)
MCV: 87.9 fL (ref 80.0–100.0)
Platelets: 214 10*3/uL (ref 150–400)
Platelets: 241 10*3/uL (ref 150–400)
RBC: 4.25 MIL/uL (ref 3.87–5.11)
RBC: 4.46 MIL/uL (ref 3.87–5.11)
RDW: 12.5 % (ref 11.5–15.5)
RDW: 12.4 % (ref 11.5–15.5)
WBC: 11.8 10*3/uL — ABNORMAL HIGH (ref 4.0–10.5)
WBC: 20.1 10*3/uL — ABNORMAL HIGH (ref 4.0–10.5)
nRBC: 0 % (ref 0.0–0.2)
nRBC: 0 % (ref 0.0–0.2)

## 2023-04-04 LAB — COMPREHENSIVE METABOLIC PANEL
ALT: 15 U/L (ref 0–44)
ALT: 15 U/L (ref 0–44)
AST: 19 U/L (ref 15–41)
AST: 21 U/L (ref 15–41)
Albumin: 2.8 g/dL — ABNORMAL LOW (ref 3.5–5.0)
Albumin: 3 g/dL — ABNORMAL LOW (ref 3.5–5.0)
Alkaline Phosphatase: 99 U/L (ref 38–126)
Alkaline Phosphatase: 113 U/L (ref 38–126)
Anion gap: 9 (ref 5–15)
Anion gap: 14 (ref 5–15)
BUN: 11 mg/dL (ref 6–20)
BUN: 7 mg/dL (ref 6–20)
CO2: 17 mmol/L — ABNORMAL LOW (ref 22–32)
CO2: 15 mmol/L — ABNORMAL LOW (ref 22–32)
Calcium: 8.8 mg/dL — ABNORMAL LOW (ref 8.9–10.3)
Calcium: 9.1 mg/dL (ref 8.9–10.3)
Chloride: 109 mmol/L (ref 98–111)
Chloride: 105 mmol/L (ref 98–111)
Creatinine, Ser: 0.72 mg/dL (ref 0.44–1.00)
Creatinine, Ser: 0.83 mg/dL (ref 0.44–1.00)
GFR, Estimated: >60 mL/min (ref >60)
GFR, Estimated: >60 mL/min (ref >60)
Glucose, Bld: 124 mg/dL — ABNORMAL HIGH (ref 70–99)
Glucose, Bld: 86 mg/dL (ref 70–99)
Potassium: 3.9 mmol/L (ref 3.5–5.1)
Potassium: 3.8 mmol/L (ref 3.5–5.1)
Sodium: 135 mmol/L (ref 135–145)
Sodium: 134 mmol/L — ABNORMAL LOW (ref 135–145)
Total Bilirubin: 0.4 mg/dL (ref 0.0–1.2)
Total Bilirubin: 0.6 mg/dL (ref 0.0–1.2)
Total Protein: 6.1 g/dL — ABNORMAL LOW (ref 6.5–8.1)
Total Protein: 6.3 g/dL — ABNORMAL LOW (ref 6.5–8.1)

## 2023-04-04 LAB — TYPE AND SCREEN
ABO/RH(D): AB POS
Antibody Screen: NEGATIVE

## 2023-04-04 LAB — RPR: RPR Ser Ql: NONREACTIVE

## 2023-04-04 SURGERY — Surgical Case
Anesthesia: Epidural

## 2023-04-04 MED ORDER — FENTANYL CITRATE (PF) 100 MCG/2ML IJ SOLN
100.0000 ug | INTRAMUSCULAR | Status: DC | PRN
  Administered 2023-04-04: 250 ug via INTRAVENOUS
  Administered 2023-04-04 (×2): 100 ug via INTRAVENOUS
  Filled 2023-04-04: qty 2

## 2023-04-04 MED ORDER — OXYTOCIN BOLUS FROM INFUSION: 333.0000 mL | Freq: Once | INTRAVENOUS | Status: DC

## 2023-04-04 MED ORDER — NALOXONE HCL 0.4 MG/ML IJ SOLN: 0.4000 mg | INTRAMUSCULAR | Status: DC | PRN

## 2023-04-04 MED ORDER — ARTIFICIAL TEARS OPHTHALMIC OINT
TOPICAL_OINTMENT | OPHTHALMIC | Status: AC
  Filled 2023-04-04: qty 3.5

## 2023-04-04 MED ORDER — HYDRALAZINE HCL 20 MG/ML IJ SOLN: 10.0000 mg | INTRAMUSCULAR | Status: DC | PRN

## 2023-04-04 MED ORDER — EPHEDRINE 5 MG/ML INJ: 10.0000 mg | INTRAVENOUS | Status: DC | PRN

## 2023-04-04 MED ORDER — TERBUTALINE SULFATE 1 MG/ML IJ SOLN: 0.2500 mg | Freq: Once | INTRAMUSCULAR | Status: DC | PRN

## 2023-04-04 MED ORDER — ONDANSETRON HCL 4 MG/2ML IJ SOLN
4.0000 mg | Freq: Four times a day (QID) | INTRAMUSCULAR | Status: DC | PRN
  Administered 2023-04-04: 4 mg via INTRAVENOUS

## 2023-04-04 MED ORDER — PENICILLIN G POT IN DEXTROSE 60000 UNIT/ML IV SOLN
3.0000 10*6.[IU] | INTRAVENOUS | Status: DC
  Administered 2023-04-04 (×3): 3 10*6.[IU] via INTRAVENOUS
  Filled 2023-04-04 (×3): qty 50

## 2023-04-04 MED ORDER — OXYTOCIN-SODIUM CHLORIDE 30-0.9 UT/500ML-% IV SOLN
2.5000 [IU]/h | INTRAVENOUS | Status: DC
  Administered 2023-04-04: 30 [IU] via INTRAVENOUS
  Filled 2023-04-04: qty 500

## 2023-04-04 MED ORDER — FENTANYL CITRATE (PF) 100 MCG/2ML IJ SOLN
INTRAMUSCULAR | Status: AC
  Filled 2023-04-04: qty 2

## 2023-04-04 MED ORDER — LIDOCAINE-EPINEPHRINE (PF) 1.5 %-1:200000 IJ SOLN
INTRAMUSCULAR | Status: DC | PRN
  Administered 2023-04-04: 5 mL via EPIDURAL

## 2023-04-04 MED ORDER — KETAMINE HCL 50 MG/5ML IJ SOSY
PREFILLED_SYRINGE | INTRAMUSCULAR | Status: AC
  Filled 2023-04-04: qty 5

## 2023-04-04 MED ORDER — LACTATED RINGERS IV SOLN
500.0000 mL | INTRAVENOUS | Status: DC | PRN
  Administered 2023-04-04: 500 mL via INTRAVENOUS

## 2023-04-04 MED ORDER — CEFAZOLIN SODIUM-DEXTROSE 2-3 GM-%(50ML) IV SOLR
INTRAVENOUS | Status: DC | PRN
Start: 2023-04-04 — End: 2023-04-04
  Administered 2023-04-04: 2 g via INTRAVENOUS

## 2023-04-04 MED ORDER — LABETALOL HCL 5 MG/ML IV SOLN: 20.0000 mg | INTRAVENOUS | Status: DC | PRN

## 2023-04-04 MED ORDER — SODIUM CHLORIDE 0.9% FLUSH: 3.0000 mL | INTRAVENOUS | Status: DC | PRN

## 2023-04-04 MED ORDER — ONDANSETRON HCL 4 MG/2ML IJ SOLN
INTRAMUSCULAR | Status: AC
  Filled 2023-04-04: qty 2

## 2023-04-04 MED ORDER — MORPHINE SULFATE (PF) 0.5 MG/ML IJ SOLN
INTRAMUSCULAR | Status: AC
Start: 2023-04-04 — End: ?
  Filled 2023-04-04: qty 10

## 2023-04-04 MED ORDER — LABETALOL HCL 5 MG/ML IV SOLN: 40.0000 mg | INTRAVENOUS | Status: DC | PRN

## 2023-04-04 MED ORDER — LIDOCAINE HCL (PF) 1 % IJ SOLN
INTRAMUSCULAR | Status: DC | PRN
  Administered 2023-04-04: 5 mL via EPIDURAL

## 2023-04-04 MED ORDER — OXYCODONE-ACETAMINOPHEN 5-325 MG PO TABS: 2.0000 | ORAL_TABLET | ORAL | Status: DC | PRN

## 2023-04-04 MED ORDER — OXYCODONE-ACETAMINOPHEN 5-325 MG PO TABS: 1.0000 | ORAL_TABLET | ORAL | Status: DC | PRN

## 2023-04-04 MED ORDER — NALOXONE HCL 4 MG/10ML IJ SOLN: 1.0000 ug/kg/h | INTRAVENOUS | Status: DC | PRN

## 2023-04-04 MED ORDER — LACTATED RINGERS IV SOLN
INTRAVENOUS | Status: DC
  Administered 2023-04-04 (×2): 125 mL/h via INTRAVENOUS

## 2023-04-04 MED ORDER — LACTATED RINGERS IV SOLN: 500.0000 mL | Freq: Once | INTRAVENOUS | Status: DC

## 2023-04-04 MED ORDER — LIDOCAINE 2% (20 MG/ML) 5 ML SYRINGE
INTRAMUSCULAR | Status: AC
  Filled 2023-04-04: qty 5

## 2023-04-04 MED ORDER — DIPHENHYDRAMINE HCL 50 MG/ML IJ SOLN: 12.5000 mg | Freq: Four times a day (QID) | INTRAMUSCULAR | Status: DC | PRN

## 2023-04-04 MED ORDER — AMISULPRIDE (ANTIEMETIC) 5 MG/2ML IV SOLN: 10.0000 mg | Freq: Once | INTRAVENOUS | Status: DC | PRN

## 2023-04-04 MED ORDER — LABETALOL HCL 5 MG/ML IV SOLN: 80.0000 mg | INTRAVENOUS | Status: DC | PRN

## 2023-04-04 MED ORDER — SUCCINYLCHOLINE CHLORIDE 200 MG/10ML IV SOSY
PREFILLED_SYRINGE | INTRAVENOUS | Status: AC
  Filled 2023-04-04: qty 10

## 2023-04-04 MED ORDER — ACETAMINOPHEN 10 MG/ML IV SOLN
INTRAVENOUS | Status: DC | PRN
  Administered 2023-04-04: 1000 mg via INTRAVENOUS

## 2023-04-04 MED ORDER — PHENYLEPHRINE HCL (PRESSORS) 10 MG/ML IV SOLN
INTRAVENOUS | Status: DC | PRN
  Administered 2023-04-04: 160 ug via INTRAVENOUS

## 2023-04-04 MED ORDER — LIDOCAINE-EPINEPHRINE (PF) 2 %-1:200000 IJ SOLN
INTRAMUSCULAR | Status: DC | PRN
  Administered 2023-04-04: 10 mL via EPIDURAL
  Administered 2023-04-04: 2 mL via EPIDURAL
  Administered 2023-04-04: 5 mL via EPIDURAL

## 2023-04-04 MED ORDER — DIPHENHYDRAMINE HCL 50 MG/ML IJ SOLN: 12.5000 mg | INTRAMUSCULAR | Status: DC | PRN

## 2023-04-04 MED ORDER — FENTANYL CITRATE (PF) 250 MCG/5ML IJ SOLN
INTRAMUSCULAR | Status: AC
  Filled 2023-04-04: qty 5

## 2023-04-04 MED ORDER — MISOPROSTOL 50MCG HALF TABLET
50.0000 ug | ORAL_TABLET | ORAL | Status: DC | PRN
  Administered 2023-04-04 (×2): 50 ug via VAGINAL
  Filled 2023-04-04 (×2): qty 1

## 2023-04-04 MED ORDER — OXYCODONE HCL 5 MG/5ML PO SOLN: 5.0000 mg | Freq: Once | ORAL | Status: DC | PRN

## 2023-04-04 MED ORDER — FENTANYL-BUPIVACAINE-NACL 0.5-0.125-0.9 MG/250ML-% EP SOLN
12.0000 mL/h | EPIDURAL | Status: DC | PRN
  Administered 2023-04-04: 12 mL/h via EPIDURAL
  Filled 2023-04-04: qty 250

## 2023-04-04 MED ORDER — FENTANYL CITRATE (PF) 100 MCG/2ML IJ SOLN: 25.0000 ug | INTRAMUSCULAR | Status: DC | PRN

## 2023-04-04 MED ORDER — TRANEXAMIC ACID-NACL 1000-0.7 MG/100ML-% IV SOLN
INTRAVENOUS | Status: DC | PRN
Start: 2023-04-04 — End: 2023-04-04
  Administered 2023-04-04: 1000 mg via INTRAVENOUS

## 2023-04-04 MED ORDER — LIDOCAINE-EPINEPHRINE (PF) 2 %-1:200000 IJ SOLN
INTRAMUSCULAR | Status: AC
  Filled 2023-04-04: qty 20

## 2023-04-04 MED ORDER — DIPHENHYDRAMINE HCL 25 MG PO CAPS: 25.0000 mg | ORAL_CAPSULE | Freq: Four times a day (QID) | ORAL | Status: DC | PRN

## 2023-04-04 MED ORDER — ONDANSETRON HCL 4 MG/2ML IJ SOLN: 4.0000 mg | Freq: Three times a day (TID) | INTRAMUSCULAR | Status: DC | PRN

## 2023-04-04 MED ORDER — PHENYLEPHRINE 80 MCG/ML (10ML) SYRINGE FOR IV PUSH (FOR BLOOD PRESSURE SUPPORT)
80.0000 ug | PREFILLED_SYRINGE | INTRAVENOUS | Status: AC | PRN
  Administered 2023-04-04 (×3): 80 ug via INTRAVENOUS
  Filled 2023-04-04: qty 10

## 2023-04-04 MED ORDER — ACETAMINOPHEN 10 MG/ML IV SOLN: 1000.0000 mg | Freq: Once | INTRAVENOUS | Status: DC | PRN

## 2023-04-04 MED ORDER — PHENYLEPHRINE 80 MCG/ML (10ML) SYRINGE FOR IV PUSH (FOR BLOOD PRESSURE SUPPORT)
PREFILLED_SYRINGE | INTRAVENOUS | Status: AC
  Filled 2023-04-04: qty 10

## 2023-04-04 MED ORDER — DEXAMETHASONE SODIUM PHOSPHATE 10 MG/ML IJ SOLN
INTRAMUSCULAR | Status: DC | PRN
  Administered 2023-04-04: 10 mg via INTRAVENOUS

## 2023-04-04 MED ORDER — ACETAMINOPHEN 325 MG PO TABS: 650.0000 mg | ORAL_TABLET | ORAL | Status: DC | PRN

## 2023-04-04 MED ORDER — LIDOCAINE HCL (PF) 1 % IJ SOLN: 30.0000 mL | INTRAMUSCULAR | Status: DC | PRN

## 2023-04-04 MED ORDER — PHENYLEPHRINE 80 MCG/ML (10ML) SYRINGE FOR IV PUSH (FOR BLOOD PRESSURE SUPPORT)
80.0000 ug | PREFILLED_SYRINGE | INTRAVENOUS | Status: DC | PRN
Start: 2023-04-04 — End: 2023-04-05

## 2023-04-04 MED ORDER — SOD CITRATE-CITRIC ACID 500-334 MG/5ML PO SOLN: 30.0000 mL | ORAL | Status: DC | PRN

## 2023-04-04 MED ORDER — OXYTOCIN-SODIUM CHLORIDE 30-0.9 UT/500ML-% IV SOLN: 1.0000 m[IU]/min | INTRAVENOUS | Status: DC

## 2023-04-04 MED ORDER — SCOPOLAMINE 1 MG/3DAYS TD PT72: 1.0000 | MEDICATED_PATCH | Freq: Once | TRANSDERMAL | Status: DC

## 2023-04-04 MED ORDER — MEPERIDINE HCL 25 MG/ML IJ SOLN: 6.2500 mg | INTRAMUSCULAR | Status: DC | PRN

## 2023-04-04 MED ORDER — PHENYLEPHRINE 80 MCG/ML (10ML) SYRINGE FOR IV PUSH (FOR BLOOD PRESSURE SUPPORT)
PREFILLED_SYRINGE | INTRAVENOUS | Status: AC
Start: 2023-04-04 — End: ?
  Filled 2023-04-04: qty 10

## 2023-04-04 MED ORDER — SODIUM CHLORIDE 0.9 % IV SOLN
INTRAVENOUS | Status: DC | PRN
  Administered 2023-04-04: 500 mg via INTRAVENOUS

## 2023-04-04 MED ORDER — SUCCINYLCHOLINE CHLORIDE 200 MG/10ML IV SOSY
PREFILLED_SYRINGE | INTRAVENOUS | Status: DC | PRN
  Administered 2023-04-04: 120 mg via INTRAVENOUS

## 2023-04-04 MED ORDER — PROPOFOL 10 MG/ML IV BOLUS
INTRAVENOUS | Status: DC | PRN
  Administered 2023-04-04: 150 mg via INTRAVENOUS

## 2023-04-04 MED ORDER — MORPHINE SULFATE (PF) 10 MG/ML IV SOLN
INTRAVENOUS | Status: DC | PRN
  Administered 2023-04-04: 3 mg via BUCCAL

## 2023-04-04 MED ORDER — DEXAMETHASONE SODIUM PHOSPHATE 10 MG/ML IJ SOLN
INTRAMUSCULAR | Status: AC
  Filled 2023-04-04: qty 1

## 2023-04-04 MED ORDER — OXYCODONE HCL 5 MG PO TABS: 5.0000 mg | ORAL_TABLET | Freq: Once | ORAL | Status: DC | PRN

## 2023-04-04 MED ORDER — SODIUM CHLORIDE 0.9 % IV SOLN
5.0000 10*6.[IU] | Freq: Once | INTRAVENOUS | Status: AC
  Administered 2023-04-04: 5 10*6.[IU] via INTRAVENOUS
  Filled 2023-04-04: qty 5

## 2023-04-04 MED ORDER — PHENYLEPHRINE HCL-NACL 20-0.9 MG/250ML-% IV SOLN
INTRAVENOUS | Status: DC | PRN
  Administered 2023-04-04: 60 ug/min via INTRAVENOUS
  Administered 2023-04-04: 80 ug via INTRAVENOUS

## 2023-04-04 SURGICAL SUPPLY — 31 items
BENZOIN TINCTURE PRP APPL 2/3 (GAUZE/BANDAGES/DRESSINGS) IMPLANT
CHLORAPREP W/TINT 26 (MISCELLANEOUS) ×2 IMPLANT
CLAMP UMBILICAL CORD (MISCELLANEOUS) ×1 IMPLANT
CLOTH BEACON ORANGE TIMEOUT ST (SAFETY) ×1 IMPLANT
DERMABOND ADVANCED .7 DNX12 (GAUZE/BANDAGES/DRESSINGS) IMPLANT
DERMABOND ADVANCED .7 DNX6 (GAUZE/BANDAGES/DRESSINGS) IMPLANT
DRSG OPSITE POSTOP 4X10 (GAUZE/BANDAGES/DRESSINGS) ×1 IMPLANT
ELECT REM PT RETURN 9FT ADLT (ELECTROSURGICAL) ×1 IMPLANT
ELECTRODE REM PT RTRN 9FT ADLT (ELECTROSURGICAL) ×1 IMPLANT
EXTRACTOR VACUUM KIWI (MISCELLANEOUS) IMPLANT
GLOVE BIO SURGEON STRL SZ7 (GLOVE) ×1 IMPLANT
GLOVE BIOGEL PI IND STRL 7.0 (GLOVE) ×1 IMPLANT
GOWN STRL REUS W/TWL LRG LVL3 (GOWN DISPOSABLE) ×2 IMPLANT
KIT ABG SYR 3ML LUER SLIP (SYRINGE) IMPLANT
NDL HYPO 25X5/8 SAFETYGLIDE (NEEDLE) IMPLANT
NEEDLE HYPO 22GX1.5 SAFETY (NEEDLE) IMPLANT
NEEDLE HYPO 25X5/8 SAFETYGLIDE (NEEDLE) IMPLANT
NS IRRIG 1000ML POUR BTL (IV SOLUTION) ×1 IMPLANT
PACK C SECTION WH (CUSTOM PROCEDURE TRAY) ×1 IMPLANT
PAD OB MATERNITY 4.3X12.25 (PERSONAL CARE ITEMS) ×1 IMPLANT
RTRCTR C-SECT PINK 25CM LRG (MISCELLANEOUS) ×1 IMPLANT
STRIP CLOSURE SKIN 1/2X4 (GAUZE/BANDAGES/DRESSINGS) IMPLANT
SUT CHROMIC 1 CTX 36 (SUTURE) ×2 IMPLANT
SUT CHROMIC 2 0 CT 1 (SUTURE) ×1 IMPLANT
SUT PDS AB 0 CTX 60 (SUTURE) ×1 IMPLANT
SUT VIC AB 2-0 CT1 TAPERPNT 27 (SUTURE) ×1 IMPLANT
SUT VIC AB 4-0 KS 27 (SUTURE) IMPLANT
SYR 30ML LL (SYRINGE) IMPLANT
TOWEL OR 17X24 6PK STRL BLUE (TOWEL DISPOSABLE) ×1 IMPLANT
TRAY FOLEY W/BAG SLVR 14FR LF (SET/KITS/TRAYS/PACK) ×1 IMPLANT
WATER STERILE IRR 1000ML POUR (IV SOLUTION) ×1 IMPLANT

## 2023-04-04 NOTE — Progress Notes (Addendum)
 Patient ID: Laura Glass, female   DOB: 06-Aug-1985, 38 y.o.   MRN: [id number]  S: Comfortable with contractions O: Afebrile, vital signs stable Alert and oriented x 3, no apparent distress Gravid, soft Fetal heart rate 130, reactive, category 1 tracing Cervix 1/70/-2 Toco irregular  Foley catheter placed through cervix and balloon inflated with 60 cc without difficulty  Assessment and plan: 1) Foley balloon placed for continued cervical ripening 2) fetal wellbeing reassuring 3) penicillin for GBS 4) blood pressures remain in the mild range

## 2023-04-04 NOTE — Anesthesia Procedure Notes (Signed)
 Procedure Name: Intubation Date/Time: 04/04/2023 10:34 PM  Performed by: Orlie Pollen, CRNAPre-anesthesia Checklist: Patient identified, Emergency Drugs available, Suction available and Patient being monitored Patient Re-evaluated:Patient Re-evaluated prior to induction Oxygen Delivery Method: Circle system utilized Preoxygenation: Pre-oxygenation with 100% oxygen Induction Type: IV induction, Rapid sequence and Cricoid Pressure applied Laryngoscope Size: 3 and Glidescope Grade View: Grade II Tube type: Oral Tube size: 7.0 mm Number of attempts: 1 Airway Equipment and Method: Stylet Placement Confirmation: ETT inserted through vocal cords under direct vision, positive ETCO2 and breath sounds checked- equal and bilateral Secured at: 21 (right lip) cm Tube secured with: Tape Dental Injury: Teeth and Oropharynx as per pre-operative assessment

## 2023-04-04 NOTE — Transfer of Care (Signed)
 Immediate Anesthesia Transfer of Care Note  Patient: Laura Glass  Procedure(s) Performed: CESAREAN DELIVERY  Patient Location: PACU  Anesthesia Type:General  Level of Consciousness: drowsy  Airway & Oxygen Therapy: Patient Spontanous Breathing and Patient connected to nasal cannula oxygen  Post-op Assessment: Report given to RN and Post -op Vital signs reviewed and stable  Post vital signs: Reviewed and stable  Last Vitals:  Vitals Value Taken Time  BP 98/51 04/04/23 2330  Temp    Pulse 100 04/04/23 2337  Resp 25 04/04/23 2337  SpO2 99 % 04/04/23 2337  Vitals shown include unfiled device data.  Last Pain:  Vitals:   04/04/23 2036  TempSrc: Oral  PainSc:          Complications: No notable events documented.

## 2023-04-04 NOTE — Progress Notes (Signed)
 Patient ID: Laura Glass, female   DOB: 02-Jun-1985, 38 y.o.   MRN: [id number]  S: Uncomfortable with contractions O:  Vitals:   04/04/23 1604 04/04/23 2021 04/04/23 2036 04/04/23 2038  BP: (!) 147/100 (!) 160/94  (!) 158/81  Pulse: 81 72  82  Resp: 18     Temp: 98.2 F (36.8 C)  98.2 F (36.8 C)   TempSrc: Oral  Oral   Weight:      Height:       Alert and oriented x 3, uncomfortable appearing Gravid, soft Fetal heart rate 120, reactive, category 1 5/70/-2 Toco every to 2 minutes  AROM attempted.  Mucus and bloody show noted, minimal amniotic fluid.  Uncertain if amniotomy was successful  Assessment and plan 1) plan for epidural placement.  Will redraw PIH labs now.  Patient has had a few more severe range blood pressures however she is also in significant pain.  Will reassess blood pressures following epidural placement 2) fetal wellbeing reassuring 3) will reattempt amniotomy after epidural placement if truly unsuccessful

## 2023-04-04 NOTE — Anesthesia Preprocedure Evaluation (Signed)
 Anesthesia Evaluation  Patient identified by MRN, date of birth, ID band Patient awake    Reviewed: Allergy & Precautions, H&P , NPO status , Patient's Chart, lab work & pertinent test results  History of Anesthesia Complications Negative for: history of anesthetic complications  Airway Mallampati: II       Dental no notable dental hx.    Pulmonary neg pulmonary ROS   Pulmonary exam normal        Cardiovascular + DVT  Normal cardiovascular exam     Neuro/Psych  PSYCHIATRIC DISORDERS Anxiety Depression Bipolar Disorder   negative neurological ROS     GI/Hepatic negative GI ROS, Neg liver ROS,,,  Endo/Other  negative endocrine ROS    Renal/GU negative Renal ROS  negative genitourinary   Musculoskeletal   Abdominal  (+) + obese  Peds  Hematology  (+) Blood dyscrasia (home lovenox.)   Anesthesia Other Findings   Reproductive/Obstetrics (+) Pregnancy                             Anesthesia Physical Anesthesia Plan  ASA: 3  Anesthesia Plan: Epidural   Post-op Pain Management:    Induction:   PONV Risk Score and Plan:   Airway Management Planned:   Additional Equipment:   Intra-op Plan:   Post-operative Plan:   Informed Consent: I have reviewed the patients History and Physical, chart, labs and discussed the procedure including the risks, benefits and alternatives for the proposed anesthesia with the patient or authorized representative who has indicated his/her understanding and acceptance.       Plan Discussed with:   Anesthesia Plan Comments:        Anesthesia Quick Evaluation

## 2023-04-04 NOTE — H&P (Signed)
 Laura Glass is a 38 y.o. female presenting for Scheduled induction of labor  38 year old G1, P0 at 39+4 presents for scheduled induction of labor for history of DVT requiring anticoagulation during pregnancy.  Her pregnancy has been complicated by advanced maternal age.  She has had some episodes of elevated blood pressures but has not yet met criteria for gestational hypertension.  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  Has been on prophylactic Lovenox 40 mg subcu daily during the pregnancy 4) bipolar disorder: Sertraline 50 mg daily OB History     Gravida  1   Para      Term      Preterm      AB      Living         SAB      IAB      Ectopic      Multiple      Live Births             Past Medical History:  Diagnosis Date   Achilles tendonitis    Allergy    Anxiety    Depression    DVT (deep venous thrombosis) (HCC)    History of chicken pox    Past Surgical History:  Procedure Laterality Date   ACHILLES TENDON REPAIR Left 2021   ACHILLES TENDON SURGERY Left 12/27/2018   Procedure: LEFT ACHILLES DEBRIDEMENT + RECONSTRUCTION;  Surgeon: Terance Hart, MD;  Location: [facility];  Service: Orthopedics;  Laterality: Left;  TOTAL SURGERY REQUEST TIME: 1.5 HOURS   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 Social History:  reports that she has never  smoked. She has never used smokeless tobacco. She reports current alcohol use. She reports that she does not use drugs.     Maternal Diabetes: No Genetic Screening: Normal Maternal Ultrasounds/Referrals: Normal Fetal Ultrasounds or other Referrals:  None Maternal Substance Abuse:  No Significant Maternal Medications:  Meds include: Other: Sertraline, Lovenox 40 mg Significant Maternal Lab Results:  Group B Strep positive Number of Prenatal Visits:greater than 3 verified prenatal visits Maternal Vaccinations:TDap Other Comments:  None  Review of Systems History Dilation: Fingertip Effacement (%): Thick Station: -3 Exam by:: J Middleton RN Blood pressure (!) 151/88, pulse 80, temperature 98.2 F (36.8 C), temperature source Oral, resp. rate 16, height 5\' 4"  (1.626 m), weight 100 kg. Exam Physical Exam  Alert and oriented x 3, no apparent distress Gravid, soft Fetal heart rate 130s, reactive, category 1 tracing  Prenatal labs: ABO, Rh: --/--/AB POS (03/11 0902) Antibody: NEG (03/11 0902) Rubella: Immune (08/23 0000) RPR: Nonreactive (08/23 0000)  HBsAg: Negative (08/23 0000)  HIV: Non-reactive (08/23 0000)  GBS: Positive/-- (02/18 0000)  Results for orders placed or performed during the hospital encounter of 04/04/23 (from the past 24 hours)  CBC     Status: Abnormal   Collection Time: 04/04/23  8:02 AM  Result Value  Ref Range   WBC 11.8 (H) 4.0 - 10.5 K/uL   RBC 4.25 3.87 - 5.11 MIL/uL   Hemoglobin 12.8 12.0 - 15.0 g/dL   HCT 32.4 40.1 - 02.7 %   MCV 88.7 80.0 - 100.0 fL   MCH 30.1 26.0 - 34.0 pg   MCHC 34.0 30.0 - 36.0 g/dL   RDW 25.3 66.4 - 40.3 %   Platelets 214 150 - 400 K/uL   nRBC 0.0 0.0 - 0.2 %  Comprehensive metabolic panel     Status: Abnormal   Collection Time: 04/04/23  8:02 AM  Result Value Ref Range   Sodium 135 135 - 145 mmol/L   Potassium 3.9 3.5 - 5.1 mmol/L   Chloride 109 98 - 111 mmol/L   CO2 17 (L) 22 - 32 mmol/L   Glucose, Bld 124 (H) 70 -  99 mg/dL   BUN 11 6 - 20 mg/dL   Creatinine, Ser 4.74 0.44 - 1.00 mg/dL   Calcium 8.8 (L) 8.9 - 10.3 mg/dL   Total Protein 6.1 (L) 6.5 - 8.1 g/dL   Albumin 2.8 (L) 3.5 - 5.0 g/dL   AST 19 15 - 41 U/L   ALT 15 0 - 44 U/L   Alkaline Phosphatase 99 38 - 126 U/L   Total Bilirubin 0.4 0.0 - 1.2 mg/dL   GFR, Estimated >25 >95 mL/min   Anion gap 9 5 - 15  Type and screen     Status: None   Collection Time: 04/04/23  9:02 AM  Result Value Ref Range   ABO/RH(D) AB POS    Antibody Screen NEG    Sample Expiration      04/07/2023,2359 Performed at [facility], [address] [place], [place] [address]      Assessment/Plan: 1) admit 2) misoprostol 25 mcg per vagina every 4 hours 3) epidural on request 4) penicillin for GBS prophylaxis 5) blood pressures mildly elevated at the time of admission.  Patient officially meets criteria for gestational hypertension.  PIH labs negative.  Will monitor closely.  Labetalol per protocol for severe range blood pressures    Waynard Reeds 04/04/2023, 12:14 PM

## 2023-04-04 NOTE — Op Note (Addendum)
 Operative Report    Pre-Operative Diagnosis: 1) 39+4-week intrauterine pregnancy 2) fetal bradycardia  Postoperative Diagnosis: 1) 39+4-week intrauterine pregnancy 2) fetal bradycardia  Procedure: Emergent primary low-transverse cesarean section  Surgeon: Dr. Waynard Reeds  Assistant: Dr. Joanne Chars  Given the complexity of the case an experienced surgical assistant was required for completion of the surgical procedure  Antibiotics: Ancef 2 g, azithromycin 500 mg  Operative Findings: Vigorous female infant in the vertex presentation with Apgar scores of 9 at 1 minute and 9 at 5 minutes.  Birth weight 7 pounds 9.7 ounces, 3450 g. Normal-appearing ovaries and tubes  Specimen: Placenta for disposal  WGN:FAOZH I/O In: 2300 [I.V.:1800; IV Piggyback:500] Out: 1554 [Urine:100; Blood:1454]   Procedure:Laura Glass is an 38 year old gravida 1 para 0 at 29 weeks and 4 days estimated gestational age who presents for cesarean section.  The patient rapidly progressed in labor from 5 to complete.  She received an epidural and following epidural placement she was confirmed to be complete.  At this time the fetal tracing was noted to be having repetitive decelerations.  The patient started pushing and the fetal heart tones declined to the 90s and remained in the 90s approximately 5 minutes with no return to baseline.  Given the patient was remote from delivery, proceeding to emergency cesarean section was required.  The patient was verbally consented.  Once in the OR a Foley catheter was placed using sterile technique and the patient was emergently prepped with a Betadine splash and draped.  Allis test demonstrated the patient did not have an adequate anesthesia for surgery necessitating general anesthesia.    Following induction of general anesthesia the scalpel was used to make a Pfannenstiel skin incision which was carried down to the underlying layers of soft tissue to the fascia. The fascia was incised  in the midline and the fascial incision was extended laterally with dissection.  Rectus muscles were separated in the midline and the abdominal peritoneum was identified and entered bluntly.  A bladder blade was inserted and a scalpel was used to make a low transverse incision on the uterus which was extended laterally with blunt dissection.  The fetal vertex was found to deep in the pelvis and elevated to the hysterotomy.  The fetal vertex was delivered through the hysterotomy followed by the body without difficulty.  The infant cried vigorously after delivery.  Approximately a 45-second delay in cord clamping was performed.  At this time the axis retractor was deployed.  The uterine incision was grasped with ring forceps and the placenta was delivered spontaneously, intact, with three-vessel cord.  The uterus was cleared of all clot debris.  The hysterotomy was repaired with #1 chromic in a running locked fashion followed by a second imbricating layer. The ovaries and tubes were inspected and normal. The Alexis retractor was removed. The abdominal peritoneum was reapproximated with 2-0 Vicryl in a running fashion. The rectus muscles was reapproximated with 2-0 chromic in a running fashion. The fascia was closed with 0-looped PDS in a running fashion.  Subcutaneous tissue was prepared with 2-0 plain gut interrupted sutures.  The skin was closed with 4-0 vicryl in a subcuticular fashion and Dermabond. All laparotomy sponge, instrument, and needle counts were correct.  Patient was extubated in the OR and transferred to the PACU in stable condition.

## 2023-04-04 NOTE — Anesthesia Procedure Notes (Signed)
 Epidural Patient location during procedure: OB Start time: 04/04/2023 9:42 PM End time: 04/04/2023 9:52 PM  Staffing Anesthesiologist: Leonides Grills, MD Performed: anesthesiologist   Preanesthetic Checklist Completed: patient identified, IV checked, site marked, risks and benefits discussed, monitors and equipment checked, pre-op evaluation and timeout performed  Epidural Patient position: sitting Prep: DuraPrep Patient monitoring: heart rate, cardiac monitor, continuous pulse ox and blood pressure Approach: midline Location: L4-L5 Injection technique: LOR air  Needle:  Needle type: Tuohy  Needle gauge: 17 G Needle length: 9 cm Needle insertion depth: 7 cm Catheter type: closed end flexible Catheter size: 19 Gauge Catheter at skin depth: 12 cm Test dose: negative and 1.5% lidocaine with Epi 1:200 K  Assessment Events: blood not aspirated, no cerebrospinal fluid, injection not painful, no injection resistance and negative IV test  Additional Notes Informed consent obtained prior to proceeding including risk of failure, 1% risk of PDPH, risk of minor discomfort and bruising. Discussed alternatives to epidural analgesia and patient desires to proceed.  Timeout performed pre-procedure verifying patient name, procedure, and platelet count.  Patient tolerated procedure well. Reason for block:procedure for pain

## 2023-04-05 ENCOUNTER — Encounter (HOSPITAL_COMMUNITY): Payer: Self-pay | Admitting: Obstetrics and Gynecology

## 2023-04-05 LAB — CBC
HCT: 30.3 % — ABNORMAL LOW (ref 36.0–46.0)
Hemoglobin: 10.5 g/dL — ABNORMAL LOW (ref 12.0–15.0)
MCH: 31.1 pg (ref 26.0–34.0)
MCHC: 34.7 g/dL (ref 30.0–36.0)
MCV: 89.6 fL (ref 80.0–100.0)
Platelets: 211 10*3/uL (ref 150–400)
RBC: 3.38 MIL/uL — ABNORMAL LOW (ref 3.87–5.11)
RDW: 12.5 % (ref 11.5–15.5)
WBC: 25.1 10*3/uL — ABNORMAL HIGH (ref 4.0–10.5)
nRBC: 0 % (ref 0.0–0.2)

## 2023-04-05 LAB — CREATININE, SERUM
Creatinine, Ser: 0.91 mg/dL (ref 0.44–1.00)
GFR, Estimated: >60 mL/min (ref >60)

## 2023-04-05 MED ORDER — WITCH HAZEL-GLYCERIN EX PADS: 1.0000 | MEDICATED_PAD | CUTANEOUS | Status: DC | PRN

## 2023-04-05 MED ORDER — MENTHOL 3 MG MT LOZG: 1.0000 | LOZENGE | OROMUCOSAL | Status: DC | PRN

## 2023-04-05 MED ORDER — SERTRALINE HCL 20 MG/ML PO CONC: 50.0000 mg | Freq: Every day | ORAL | Status: DC

## 2023-04-05 MED ORDER — SIMETHICONE 80 MG PO CHEW: 80.0000 mg | CHEWABLE_TABLET | ORAL | Status: DC | PRN

## 2023-04-05 MED ORDER — ENOXAPARIN SODIUM 40 MG/0.4ML IJ SOSY
40.0000 mg | PREFILLED_SYRINGE | Freq: Two times a day (BID) | INTRAMUSCULAR | Status: DC
  Administered 2023-04-06 – 2023-04-07 (×4): 40 mg via SUBCUTANEOUS
  Filled 2023-04-05 (×4): qty 0.4

## 2023-04-05 MED ORDER — PRENATAL MULTIVITAMIN CH
1.0000 | ORAL_TABLET | Freq: Every day | ORAL | Status: DC
  Administered 2023-04-05 – 2023-04-07 (×3): 1 via ORAL
  Filled 2023-04-05 (×3): qty 1

## 2023-04-05 MED ORDER — DIPHENHYDRAMINE HCL 25 MG PO CAPS: 25.0000 mg | ORAL_CAPSULE | Freq: Four times a day (QID) | ORAL | Status: DC | PRN

## 2023-04-05 MED ORDER — SIMETHICONE 80 MG PO CHEW
80.0000 mg | CHEWABLE_TABLET | Freq: Three times a day (TID) | ORAL | Status: DC
  Administered 2023-04-05 – 2023-04-07 (×6): 80 mg via ORAL
  Filled 2023-04-05 (×7): qty 1

## 2023-04-05 MED ORDER — OXYCODONE HCL 5 MG PO TABS
5.0000 mg | ORAL_TABLET | ORAL | Status: DC | PRN
  Administered 2023-04-06: 5 mg via ORAL
  Filled 2023-04-05: qty 1

## 2023-04-05 MED ORDER — KETOROLAC TROMETHAMINE 30 MG/ML IJ SOLN
30.0000 mg | Freq: Four times a day (QID) | INTRAMUSCULAR | Status: AC
  Administered 2023-04-05 (×4): 30 mg via INTRAVENOUS
  Filled 2023-04-05 (×4): qty 1

## 2023-04-05 MED ORDER — COCONUT OIL OIL: 1.0000 | TOPICAL_OIL | Status: DC | PRN

## 2023-04-05 MED ORDER — ENOXAPARIN SODIUM 40 MG/0.4ML IJ SOSY: 40.0000 mg | PREFILLED_SYRINGE | INTRAMUSCULAR | Status: DC

## 2023-04-05 MED ORDER — ZOLPIDEM TARTRATE 5 MG PO TABS: 5.0000 mg | ORAL_TABLET | Freq: Every evening | ORAL | Status: DC | PRN

## 2023-04-05 MED ORDER — IBUPROFEN 600 MG PO TABS
600.0000 mg | ORAL_TABLET | Freq: Four times a day (QID) | ORAL | Status: DC
  Administered 2023-04-06 – 2023-04-07 (×7): 600 mg via ORAL
  Filled 2023-04-05 (×7): qty 1

## 2023-04-05 MED ORDER — SERTRALINE HCL 50 MG PO TABS
50.0000 mg | ORAL_TABLET | Freq: Every day | ORAL | Status: DC
  Administered 2023-04-06 (×2): 50 mg via ORAL
  Filled 2023-04-05 (×2): qty 1

## 2023-04-05 MED ORDER — DIBUCAINE (PERIANAL) 1 % EX OINT: 1.0000 | TOPICAL_OINTMENT | CUTANEOUS | Status: DC | PRN

## 2023-04-05 MED ORDER — SENNOSIDES-DOCUSATE SODIUM 8.6-50 MG PO TABS
2.0000 | ORAL_TABLET | Freq: Every day | ORAL | Status: DC
  Administered 2023-04-05 – 2023-04-07 (×3): 2 via ORAL
  Filled 2023-04-05 (×3): qty 2

## 2023-04-05 MED ORDER — OXYTOCIN-SODIUM CHLORIDE 30-0.9 UT/500ML-% IV SOLN: 2.5000 [IU]/h | INTRAVENOUS | Status: AC

## 2023-04-05 MED ORDER — ACETAMINOPHEN 500 MG PO TABS
1000.0000 mg | ORAL_TABLET | Freq: Four times a day (QID) | ORAL | Status: DC
Start: 2023-04-05 — End: 2023-04-07
  Administered 2023-04-05 – 2023-04-07 (×9): 1000 mg via ORAL
  Filled 2023-04-05 (×9): qty 2

## 2023-04-05 NOTE — Progress Notes (Addendum)
 Subjective: Postpartum Day 1: Cesarean Delivery Foley in. Has not ambulated. Tolerating some CLD, no PO. Reports pain uncontrolled, however no opioids administered. No flatus  Objective: Vital signs in last 24 hours: Temp:  [97.5 F (36.4 C)-98.9 F (37.2 C)] 98.4 F (36.9 C) (03/12 0832) Pulse Rate:  [67-160] 83 (03/12 1003) Resp:  [13-20] 18 (03/12 0832) BP: (81-171)/(39-100) 134/77 (03/12 1003) SpO2:  [95 %-100 %] 97 % (03/12 0832)  Physical Exam:  General: cooperative, fatigued, and no distress Lochia: appropriate Uterine Fundus: firm Incision: Honeycomb in place, no strikethrough DVT Evaluation: No evidence of DVT seen on physical exam. Calf/Ankle edema is present.  Recent Labs    04/04/23 2102 04/05/23 0450  HGB 13.5 10.5*  HCT 39.2 30.3*    Assessment/Plan: 38 yo G1P1 POD#1 s/p emergent PLTCS for NRFHT  - PP: IS teaching done, foley to be removed. Encouraged ambulation. Optimize analgesia, encouraged patient to ask for pain meds as needed. ADAT - ABLA 2/2 postpartum hemorrhage: EBL 1454, clinically significant for this hospitalization. Hb 10.5 - Bipolar disorder: Continue Sertraline  - History of DVT x 2: Lovenox  40 q12 (intermediate dose per ACOG) - Desires circ. 3/13 per peds  Junius Creamer, DO 04/05/2023, 10:07 AM

## 2023-04-05 NOTE — Lactation Note (Addendum)
 This note was copied from a baby's chart. Lactation Consultation Note  Patient Name: Laura Glass ZOXWR'U Date: 04/05/2023 Age:36 hours Reason for consult: Initial assessment;Primapara;1st time breastfeeding;Term  P1, 39 wks, @ 9 hrs of life. LC rounded to mom first thing, encouraged mom to call for latch assist anytime. Highlighted small belly size of newborn- formula feeds full meal @ 13-15 ml in first 2 meals. LC services and milk storage shared with mom.   Maternal Data Does the patient have breastfeeding experience prior to this delivery?: No  Feeding Mother's Current Feeding Choice: Breast Milk and Formula Nipple Type: Slow - flow   Interventions Interventions: Education;LC Services brochure;CDC milk storage guidelines  Discharge Pump: Personal;Manual;DEBP (Per mom has electric and manual breast pump @ home)  Consult Status Consult Status: Follow-up Date: 04/05/23 Follow-up type: In-patient    [facility] 04/05/2023, 7:51 AM

## 2023-04-05 NOTE — Lactation Note (Signed)
 This note was copied from a baby's chart. Lactation Consultation Note  Patient Name: Laura Glass WUJWJ'X Date: 04/05/2023  Reason for consult: 1st time breastfeeding;Mother's request;Primapara;Difficult latch;Term  P1, 39 wks, @ 12 hrs of age.  Baby coming to breast for first latch, previously bottle fed- discussed differences between baby learning breast and bottle.  With a good amount of encouragement and several attempts @ latching, baby does understand steps of latching and is able to work @ breast- 10 minutes each side- 20 minutes overall of well paced suckling. Discussed expectations @ breast- Day 1- sleepy/ feed every 3 hrs/ even 10 minutes is okay, Day 2 more awake/ feeding cues/longer feeds, and cluster feeding overnights brings milk in. Highlighted breast stimulation is tied directly to milk production. Discussed hands on breast and baby, keeping baby awake @ breast. Starting with hand expression & breast compression to get baby working @ breast, and gentle stimulation to keep baby working @ breast.   Maternal Data Does the patient have breastfeeding experience prior to this delivery?: No  Feeding Mother's Current Feeding Choice: Breast Milk and Formula  LATCH Score Latch: Repeated attempts needed to sustain latch, nipple held in mouth throughout feeding, stimulation needed to elicit sucking reflex.  Audible Swallowing: Spontaneous and intermittent (A few to start, by end of feed strong and intermittent)  Type of Nipple: Everted at rest and after stimulation  Comfort (Breast/Nipple): Soft / non-tender  Hold (Positioning): Assistance needed to correctly position infant at breast and maintain latch.  LATCH Score: 8   Interventions Interventions: Breast feeding basics reviewed;Assisted with latch;Hand express;Breast compression;Education  Discharge Pump: Personal  Consult Status Consult Status: Follow-up Date: 04/05/23 Follow-up type: In-patient    [facility] 04/05/2023, 12:18 PM

## 2023-04-05 NOTE — Lactation Note (Signed)
 This note was copied from a baby's chart. Lactation Consultation Note  Patient Name: Laura Glass ZOXWR'U Date: 04/05/2023 Age:36 hours Reason for consult: Primapara;1st time breastfeeding;Term  P1, 39 wks, @ 17 hrs of life. Mom requests latch assist. Started in cross-cradle, moved into football positioning. Infant responds well to new position- football used on both breasts- 15 minutes overall. Much improved from last time, adjusting to breast feeding with practice. Immediate diaper between offering breasts. Encouraged mom in [facility] availability overnight as needed- call anytime.  Maternal Data Has patient been taught Hand Expression?: Yes Does the patient have breastfeeding experience prior to this delivery?: No  Feeding Mother's Current Feeding Choice: Breast Milk and Formula  LATCH Score Latch: Grasps breast easily, tongue down, lips flanged, rhythmical sucking.  Audible Swallowing: Spontaneous and intermittent  Type of Nipple: Everted at rest and after stimulation  Comfort (Breast/Nipple): Soft / non-tender  Hold (Positioning): Full assist, staff holds infant at breast  LATCH Score: 8   Lactation Tools Discussed/Used    Interventions Interventions: Breast feeding basics reviewed;Assisted with latch;Hand express;Breast compression;Position options;Education  Discharge    Consult Status Consult Status: Follow-up Date: 04/06/23 Follow-up type: In-patient    [facility] 04/05/2023, 4:16 PM

## 2023-04-05 NOTE — Anesthesia Postprocedure Evaluation (Signed)
 Anesthesia Post Note  Patient: Laura Glass  Procedure(s) Performed: CESAREAN DELIVERY     Patient location during evaluation: PACU Anesthesia Type: General Level of consciousness: awake Pain management: pain level controlled Vital Signs Assessment: post-procedure vital signs reviewed and stable Respiratory status: spontaneous breathing, nonlabored ventilation and respiratory function stable Cardiovascular status: blood pressure returned to baseline and stable Postop Assessment: no apparent nausea or vomiting Anesthetic complications: no   No notable events documented.  Last Vitals:  Vitals:   04/05/23 0248 04/05/23 0345  BP: 139/80 (!) 140/75  Pulse: 85 90  Resp: 18 18  Temp: 37.2 C 36.8 C  SpO2: 95% 98%    Last Pain:  Vitals:   04/05/23 0348  TempSrc:   PainSc: 0-No pain   Pain Goal:                   Catheryn Bacon Kailani Brass

## 2023-04-05 NOTE — Clinical Social Work Maternal (Signed)
 CLINICAL SOCIAL WORK MATERNAL/CHILD NOTE  Patient Details  Name: Laura Glass MRN: [id number] Date of Birth: March 07, 1985  Date:  04/05/2023  Clinical Social Worker Initiating Note:  Enos Fling Date/Time: Initiated:  04/05/23/1503     Child's Name:  Laura Glass   Biological Parents:  Mother, Father Zeya Balles 06[birth year]-1987)   Need for Interpreter:  None   Reason for Referral:  Behavioral Health Concerns   Address:  [address] 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      Pediatrician Fax Number:    Risk Factors/Current Problems:  Mental Health Concerns     Cognitive State:  Alert  , Able to Concentrate  , Linear Thinking  , Insightful  , Goal Oriented     Mood/Affect:  Calm  , Comfortable  , Interested  , Relaxed     CSW Assessment: CSW received a consult for anxiety,depression and Bipolar.  CSW met MOB at beside to complete a full psychosocial assessment. CSW entered the room, introduced herself and acknowledged her family was present. MOB gave CSW verbal permission to speak about anything while her family was present. CSW explained her role and the reason for the visit.  MOB presented as calm, was agreeable to consult and remained engaged throughout encounter.  CSW collected MOB's demographic information and inquired about her mental health history. MOB reported being diagnosed with anxiety, depression and bipolar. MOB reported the Bipolar diagnoses came about 4-5 years ago and said "its been a little while since her last episode". MOB reported her Bipolar as depressive and is currently prescribed Zoloft 50mg  for support. MOB reported participating in therapy with "Jimmey Ralph" at [facility] every other week and plans to continue both supportive avenues throughout this PP period. CSW provided education regarding the baby blues period vs. perinatal mood disorders, discussed treatment and gave resources for mental health follow up if concerns arise.  CSW recommends self-evaluation during the postpartum time period using the New Mom Checklist from Postpartum Progress and encouraged MOB to contact a medical professional  if symptoms are noted at any time.  CSW assessed for safety with MOB SI/HI/DV;MOB denied all.  CSW asked MOB does she receive support resources; MOB said No(WIC and food stamps).  MOB reported having all essential items for the infant including a carseat, bassinet and crib for safe sleeping. CSW provided review of Sudden Infant Death Syndrome (SIDS) precautions.    CSW Plan/Description:  CSW identifies no further need for intervention and no barriers to discharge at this time.    Barnetta Chapel, LCSW 04/05/2023, 3:09 PM

## 2023-04-06 LAB — BIRTH TISSUE RECOVERY COLLECTION (PLACENTA DONATION)

## 2023-04-06 LAB — CBC WITH DIFFERENTIAL/PLATELET
Abs Immature Granulocytes: 0.2 10*3/uL — ABNORMAL HIGH (ref 0.00–0.07)
Basophils Absolute: 0 10*3/uL (ref 0.0–0.1)
Basophils Relative: 0 %
Eosinophils Absolute: 0.1 10*3/uL (ref 0.0–0.5)
Eosinophils Relative: 0 %
HCT: 26.2 % — ABNORMAL LOW (ref 36.0–46.0)
Hemoglobin: 8.9 g/dL — ABNORMAL LOW (ref 12.0–15.0)
Immature Granulocytes: 1 %
Lymphocytes Relative: 14 %
Lymphs Abs: 2.3 10*3/uL (ref 0.7–4.0)
MCH: 30.7 pg (ref 26.0–34.0)
MCHC: 34 g/dL (ref 30.0–36.0)
MCV: 90.3 fL (ref 80.0–100.0)
Monocytes Absolute: 1.4 10*3/uL — ABNORMAL HIGH (ref 0.1–1.0)
Monocytes Relative: 8 %
Neutro Abs: 12.7 10*3/uL — ABNORMAL HIGH (ref 1.7–7.7)
Neutrophils Relative %: 77 %
Platelets: 178 10*3/uL (ref 150–400)
RBC: 2.9 MIL/uL — ABNORMAL LOW (ref 3.87–5.11)
RDW: 12.7 % (ref 11.5–15.5)
WBC: 16.7 10*3/uL — ABNORMAL HIGH (ref 4.0–10.5)
nRBC: 0 % (ref 0.0–0.2)

## 2023-04-06 MED ORDER — NIFEDIPINE ER OSMOTIC RELEASE 30 MG PO TB24
30.0000 mg | ORAL_TABLET | Freq: Once | ORAL | Status: AC
  Administered 2023-04-06: 30 mg via ORAL
  Filled 2023-04-06: qty 1

## 2023-04-06 NOTE — Progress Notes (Signed)
 Subjective: Postpartum Day 2: Cesarean Delivery Patient reports incisional pain, tolerating PO, and no problems voiding.  Pt ambulated in room only yesterday - has not ambulated today yet. She denies CP, SOB or HA. She is tolerating PO.She desires circumcision for baby today.   Objective: Vital signs in last 24 hours: Temp:  [97.9 F (36.6 C)-98.5 F (36.9 C)] 97.9 F (36.6 C) (03/13 0546) Pulse Rate:  [83-94] 89 (03/13 0546) Resp:  [18-20] 18 (03/13 0546) BP: (127-134)/(72-80) 127/78 (03/13 0546) SpO2:  [99 %] 99 % (03/12 1945)  Physical Exam:  General: alert, cooperative, and no distress Lochia: appropriate Uterine Fundus: firm, abd with mod distension as expected  Incision: no significant drainage,  DVT Evaluation: No evidence of DVT seen on physical exam. Calf/Ankle edema is present.  Recent Labs    04/05/23 0450 04/06/23 0514  HGB 10.5* 8.9*  HCT 30.3* 26.2*    Assessment/Plan: Status post Cesarean section. Doing well postoperatively. Encouraged pt to ambulate to improve pain relief, abd distension and to reduce risk of recurrentt DVT formation   Plan discharge to home tomorrow Discussed circumcision - .Pt understands that neonatal circumcision is not considered medically necessary and is elective. The risks include, but are not limited to bleeding, infection, damage to the penis, development of scar tissue, and having to have it redone at a later date. Pt understands theses risks and wishes to proceed  Cathrine Muster, DO 04/06/2023, 9:50 AM

## 2023-04-06 NOTE — Lactation Note (Signed)
 This note was copied from a baby's chart. Lactation Consultation Note  Patient Name: Boy Jelicia Nantz ZOXWR'U Date: 04/06/2023 Age:36 hours Reason for consult: Follow-up assessment;Term  LC attempted to see mom and baby. There is a resting sign on the door indicating parents desire to rest as of 10:15am. Lactation to return later for follow up.    Consult Status Consult Status: Follow-up Date: 04/06/23 Follow-up type: In-patient    T'Errah N Alyviah Crandle 04/06/2023, 11:00 AM

## 2023-04-07 LAB — CBC
HCT: 24.5 % — ABNORMAL LOW (ref 36.0–46.0)
Hemoglobin: 8.2 g/dL — ABNORMAL LOW (ref 12.0–15.0)
MCH: 30.7 pg (ref 26.0–34.0)
MCHC: 33.5 g/dL (ref 30.0–36.0)
MCV: 91.8 fL (ref 80.0–100.0)
Platelets: 187 10*3/uL (ref 150–400)
RBC: 2.67 MIL/uL — ABNORMAL LOW (ref 3.87–5.11)
RDW: 12.9 % (ref 11.5–15.5)
WBC: 13.5 10*3/uL — ABNORMAL HIGH (ref 4.0–10.5)
nRBC: 0 % (ref 0.0–0.2)

## 2023-04-07 LAB — COMPREHENSIVE METABOLIC PANEL
ALT: 15 U/L (ref 0–44)
AST: 23 U/L (ref 15–41)
Albumin: 2.1 g/dL — ABNORMAL LOW (ref 3.5–5.0)
Alkaline Phosphatase: 73 U/L (ref 38–126)
Anion gap: 9 (ref 5–15)
BUN: 13 mg/dL (ref 6–20)
CO2: 20 mmol/L — ABNORMAL LOW (ref 22–32)
Calcium: 8.2 mg/dL — ABNORMAL LOW (ref 8.9–10.3)
Chloride: 109 mmol/L (ref 98–111)
Creatinine, Ser: 1.02 mg/dL — ABNORMAL HIGH (ref 0.44–1.00)
GFR, Estimated: >60 mL/min (ref >60)
Glucose, Bld: 84 mg/dL (ref 70–99)
Potassium: 3.7 mmol/L (ref 3.5–5.1)
Sodium: 138 mmol/L (ref 135–145)
Total Bilirubin: 0.2 mg/dL (ref 0.0–1.2)
Total Protein: 4.9 g/dL — ABNORMAL LOW (ref 6.5–8.1)

## 2023-04-07 MED ORDER — NIFEDIPINE ER OSMOTIC RELEASE 30 MG PO TB24
30.0000 mg | ORAL_TABLET | Freq: Once | ORAL | Status: AC
  Administered 2023-04-07: 30 mg via ORAL
  Filled 2023-04-07: qty 1

## 2023-04-07 MED ORDER — OXYCODONE HCL 5 MG PO TABS: 5.0000 mg | ORAL_TABLET | ORAL | 0 refills | Status: AC | PRN

## 2023-04-07 MED ORDER — IBUPROFEN 600 MG PO TABS: 600.0000 mg | ORAL_TABLET | Freq: Four times a day (QID) | ORAL | 0 refills | Status: AC | PRN

## 2023-04-07 MED ORDER — NIFEDIPINE ER 60 MG PO TB24: 60.0000 mg | ORAL_TABLET | Freq: Every day | ORAL | 1 refills | Status: AC

## 2023-04-07 NOTE — Discharge Instructions (Signed)
 WHAT TO LOOK OUT FOR: Fever of 100.4 or above Mastitis: feels like flu and breasts hurt Infection: increased pain, swelling or redness Blood clots golf ball size or larger Postpartum depression   Congratulations on your newest addition!

## 2023-04-07 NOTE — Progress Notes (Signed)
 Patient ID: Laura Glass, female   DOB: 1986-01-25, 39 y.o.   MRN: [id number] Pt noted to have increasing BP beginning at 1945pm last night. Mild HA prior resolved with tylenol. She denied scotomata or any other associated symptoms When BP continued to stay elevated, pt was given 30mg  procardia XL at 2340pm Mild improvement only noted in BP at this time thus ordered another dose of procardia 30xl to be given now  - Will check cmp and cbc this am to evaluate for preE. Pt still asymptomatic otherwise

## 2023-04-07 NOTE — Lactation Note (Signed)
 This note was copied from a baby's chart. Lactation Consultation Note  Patient Name: Laura Glass [id number]'M Date: 04/07/2023 Age:36 hours Reason for consult: Follow-up assessment;Primapara;1st time breastfeeding;Term;Infant weight loss  P1- MOB reports that breastfeeding infant has gotten much better over the past day, but she is still supplementing with formula. Infant had just finished drinking a bottle of formula, so LC was unable to assess a latch at this time. MOB also reports sore nipples, but no tissue breakdown. LC encouraged MOB to use coconut oil to help with the soreness. MOB denies having any questions or concerns at this time.  LC reviewed feeding infant on cue 8-12x in 24 hrs, not allowing infant to go over 3 hrs without a feeding, CDC milk storage guidelines, LC services handout and engorgement/breast care. LC encouraged MOB to call for further assistance as needed.  Maternal Data Has patient been taught Hand Expression?: No Does the patient have breastfeeding experience prior to this delivery?: No  Feeding Mother's Current Feeding Choice: Breast Milk and Formula  Lactation Tools Discussed/Used Pump Education: Milk Storage  Interventions Interventions: Breast feeding basics reviewed;Education;LC Services brochure  Discharge Discharge Education: Engorgement and breast care;Warning signs for feeding baby Pump: DEBP;Personal  Consult Status Consult Status: Complete Date: 04/07/23    Dema Severin BS, IBCLC 04/07/2023, 11:13 AM

## 2023-04-07 NOTE — Lactation Note (Signed)
 This note was copied from a baby's chart. Lactation Consultation Note  Patient Name: Laura Glass WUJWJ'X Date: 04/07/2023 Age:36 hours  Attempted to see mom but she was sleeping.   Maternal Data    Feeding Nipple Type: Slow - flow  LATCH Score                    Lactation Tools Discussed/Used    Interventions    Discharge    Consult Status      Charyl Dancer 04/07/2023, 4:34 AM

## 2023-04-07 NOTE — Discharge Summary (Signed)
 Postpartum Discharge Summary       Patient Name: Laura Glass DOB: 1985-06-27 MRN: [id number]  Date of admission: 04/04/2023 Delivery date:04/04/2023 Delivering provider: Waynard Reeds Date of discharge: 04/07/2023  Admitting diagnosis: Pregnant and not yet delivered in third trimester [Z34.93] Cesarean delivery delivered [O82] Intrauterine pregnancy: [redacted]w[redacted]d     Secondary diagnosis:  Principal Problem:   Pregnant and not yet delivered in third trimester Active Problems:   Cesarean delivery delivered  Additional problems: Advanced maternal age, History of DVT    Discharge diagnosis: Term Pregnancy Delivered and Gestational Hypertension                                              Post partum procedures: NA Augmentation: AROM, Cytotec, and IP Foley Complications: None  Hospital course: Induction of Labor With Cesarean Section   38 y.o. yo G1P1001 at [redacted]w[redacted]d was admitted to the hospital 04/04/2023 for induction of labor. Patient had a labor course significant for NRFWB. The patient went for cesarean section due to Non-Reassuring FHR. Delivery details are as follows: Membrane Rupture Time/Date: 8:41 PM,04/04/2023  Delivery Method:C-Section, Low Transverse Operative Delivery:N/A Details of operation can be found in separate operative Note.  Patient had a postpartum course complicated by nothing. She is ambulating, tolerating a regular diet, passing flatus, and urinating well.  Patient is discharged home in stable condition on 04/07/23.      Newborn Data: Birth date:04/04/2023 Birth time:10:35 PM Gender:Female Living status:Living Apgars:9 ,9  Weight:3450 g                               Magnesium Sulfate received: No BMZ received: No Rhophylac:N/A Immunizations administered: Immunization History  Administered Date(s) Administered   Tdap 07/09/2015    Physical exam  Vitals:   04/06/23 2114 04/06/23 2220 04/07/23 0100 04/07/23 0549  BP: 139/81 (!) 146/92 (!) 149/85 (!)  141/82  Pulse: 89 100 84 81  Resp:    16  Temp:    98.8 F (37.1 C)  TempSrc:    Oral  SpO2:      Weight:      Height:       General: alert, cooperative, and no distress Lochia: appropriate Uterine Fundus: firm Incision: Healing well with no significant drainage DVT Evaluation: No evidence of DVT seen on physical exam. Labs: Lab Results  Component Value Date   WBC 13.5 (H) 04/07/2023   HGB 8.2 (L) 04/07/2023   HCT 24.5 (L) 04/07/2023   MCV 91.8 04/07/2023   PLT 187 04/07/2023      Latest Ref Rng & Units 04/07/2023    6:31 AM  CMP  Glucose 70 - 99 mg/dL 84   BUN 6 - 20 mg/dL 13   Creatinine 4.09 - 1.00 mg/dL 8.11   Sodium 914 - 782 mmol/L 138   Potassium 3.5 - 5.1 mmol/L 3.7   Chloride 98 - 111 mmol/L 109   CO2 22 - 32 mmol/L 20   Calcium 8.9 - 10.3 mg/dL 8.2   Total Protein 6.5 - 8.1 g/dL 4.9   Total Bilirubin 0.0 - 1.2 mg/dL 0.2   Alkaline Phos 38 - 126 U/L 73   AST 15 - 41 U/L 23   ALT 0 - 44 U/L 15    Edinburgh Score:  04/05/2023    8:54 AM  Edinburgh Postnatal Depression Scale Screening Tool  I have been able to laugh and see the funny side of things. 0  I have looked forward with enjoyment to things. 0  I have blamed myself unnecessarily when things went wrong. 2  I have been anxious or worried for no good reason. 1  I have felt scared or panicky for no good reason. 0  Things have been getting on top of me. 1  I have been so unhappy that I have had difficulty sleeping. 0  I have felt sad or miserable. 0  I have been so unhappy that I have been crying. 0  The thought of harming myself has occurred to me. 0  Edinburgh Postnatal Depression Scale Total 4      After visit meds:  Allergies as of 04/07/2023   No Known Allergies      Medication List     STOP taking these medications    benzonatate 100 MG capsule Commonly known as: TESSALON   cetirizine 10 MG chewable tablet Commonly known as: ZYRTEC   fluticasone 50 MCG/ACT nasal  spray Commonly known as: FLONASE   montelukast 10 MG tablet Commonly known as: SINGULAIR       TAKE these medications    ibuprofen 600 MG tablet Commonly known as: ADVIL Take 1 tablet (600 mg total) by mouth every 6 (six) hours as needed.   LOVENOX [place] Inject into the skin.   NIFEdipine 60 MG 24 hr tablet Commonly known as: ADALAT CC Take 1 tablet (60 mg total) by mouth daily.   oxyCODONE 5 MG immediate release tablet Commonly known as: Roxicodone Take 1 tablet (5 mg total) by mouth every 4 (four) hours as needed.   sertraline 50 MG tablet Commonly known as: ZOLOFT Take 50 mg by mouth daily.               Discharge Care Instructions  (From admission, onward)           Start     Ordered   04/07/23 0000  Discharge wound care:       Comments: For a cesarean delivery: You may wash incision with soap and water.  Do not soak or submerge the incision for 2 weeks. Keep incision dry. You may need to keep a sanitary pad or panty liner between the incision and your clothing for comfort and to keep the incision dry. If you note drainage, increased pain, or increased redness of the incision, then please notify your physician.   04/07/23 1003   04/07/23 0000  If the dressing is still on your incision site when you go home, remove it on the third day after your surgery date. Remove dressing if it begins to fall off, or if it is dirty or damaged before the third day.       Comments: For a cesarean delivery   04/07/23 1003             Discharge home in stable condition Infant Feeding: Bottle and Breast Infant Disposition:home with mother Discharge instruction: per After Visit Summary and Postpartum booklet. Activity: Advance as tolerated. Pelvic rest for 6 weeks.  Diet: routine diet Anticipated Birth Control: Unsure Postpartum Appointment:1 week Future Appointments:No future appointments. Follow up Visit:  Follow-up Information     Waynard Reeds, MD Follow up  on 04/12/2023.   Specialty: Obstetrics and Gynecology Why: Wednesday 04/12/2023 @ 3:30 pm for a blood pressure check Contact information: [phone] [place] [address] [phone]                     04/07/2023 Waynard Reeds, MD

## 2023-04-07 NOTE — Plan of Care (Signed)
  Problem: Education: Goal: Knowledge of Childbirth will improve Outcome: Progressing Goal: Ability to make informed decisions regarding treatment and plan of care will improve Outcome: Progressing Goal: Ability to state and carry out methods to decrease the pain will improve Outcome: Progressing Goal: Individualized Educational Video(s) Outcome: Progressing   Problem: Coping: Goal: Ability to verbalize concerns and feelings about labor and delivery will improve Outcome: Progressing   Problem: Life Cycle: Goal: Ability to make normal progression through stages of labor will improve Outcome: Progressing Goal: Ability to effectively push during vaginal delivery will improve Outcome: Progressing   Problem: Role Relationship: Goal: Will demonstrate positive interactions with the child Outcome: Progressing   Problem: Safety: Goal: Risk of complications during labor and delivery will decrease Outcome: Progressing   Problem: Pain Management: Goal: Relief or control of pain from uterine contractions will improve Outcome: Progressing   Problem: Education: Goal: Knowledge of disease or condition will improve Outcome: Progressing Goal: Knowledge of the prescribed therapeutic regimen will improve Outcome: Progressing   Problem: Education: Goal: Understanding of sexual limitations or changes related to disease process or condition will improve Outcome: Progressing Goal: Individualized Educational Video(s) Outcome: Progressing   Problem: Education: Goal: Individualized Educational Video(s) Outcome: Progressing Goal: Individualized Newborn Educational Video(s) Outcome: Progressing

## 2023-04-07 NOTE — Plan of Care (Signed)
  Problem: Education: Goal: Knowledge of Childbirth will improve 04/07/2023 1010 by Donne Hazel, LPN Outcome: Adequate for Discharge 04/07/2023 1009 by Adam Phenix A, LPN Outcome: Progressing Goal: Ability to make informed decisions regarding treatment and plan of care will improve 04/07/2023 1010 by Donne Hazel, LPN Outcome: Adequate for Discharge 04/07/2023 1009 by Donne Hazel, LPN Outcome: Progressing Goal: Ability to state and carry out methods to decrease the pain will improve 04/07/2023 1010 by Donne Hazel, LPN Outcome: Adequate for Discharge 04/07/2023 1009 by Donne Hazel, LPN Outcome: Progressing Goal: Individualized Educational Video(s) 04/07/2023 1010 by Donne Hazel, LPN Outcome: Adequate for Discharge 04/07/2023 1009 by Donne Hazel, LPN Outcome: Progressing   Problem: Coping: Goal: Ability to verbalize concerns and feelings about labor and delivery will improve 04/07/2023 1010 by Donne Hazel, LPN Outcome: Adequate for Discharge 04/07/2023 1009 by Donne Hazel, LPN Outcome: Progressing   Problem: Life Cycle: Goal: Ability to make normal progression through stages of labor will improve 04/07/2023 1010 by Donne Hazel, LPN Outcome: Adequate for Discharge 04/07/2023 1009 by Donne Hazel, LPN Outcome: Progressing Goal: Ability to effectively push during vaginal delivery will improve 04/07/2023 1010 by Donne Hazel, LPN Outcome: Adequate for Discharge 04/07/2023 1009 by Donne Hazel, LPN Outcome: Progressing   Problem: Role Relationship: Goal: Will demonstrate positive interactions with the child 04/07/2023 1010 by Donne Hazel, LPN Outcome: Adequate for Discharge 04/07/2023 1009 by Donne Hazel, LPN Outcome: Progressing   Problem: Safety: Goal: Risk of complications during labor and delivery will decrease 04/07/2023 1010 by Donne Hazel, LPN Outcome: Adequate for Discharge 04/07/2023 1009 by Donne Hazel, LPN Outcome: Progressing    Problem: Pain Management: Goal: Relief or control of pain from uterine contractions will improve 04/07/2023 1010 by Donne Hazel, LPN Outcome: Adequate for Discharge 04/07/2023 1009 by Donne Hazel, LPN Outcome: Progressing   Problem: Education: Goal: Knowledge of disease or condition will improve 04/07/2023 1010 by Donne Hazel, LPN Outcome: Adequate for Discharge 04/07/2023 1009 by Donne Hazel, LPN Outcome: Progressing Goal: Knowledge of the prescribed therapeutic regimen will improve 04/07/2023 1010 by Donne Hazel, LPN Outcome: Adequate for Discharge 04/07/2023 1009 by Donne Hazel, LPN Outcome: Progressing   Problem: Education: Goal: Understanding of sexual limitations or changes related to disease process or condition will improve 04/07/2023 1010 by Donne Hazel, LPN Outcome: Adequate for Discharge 04/07/2023 1009 by Donne Hazel, LPN Outcome: Progressing Goal: Individualized Educational Video(s) 04/07/2023 1010 by Donne Hazel, LPN Outcome: Adequate for Discharge 04/07/2023 1009 by Donne Hazel, LPN Outcome: Progressing   Problem: Education: Goal: Individualized Educational Video(s) 04/07/2023 1010 by Donne Hazel, LPN Outcome: Adequate for Discharge 04/07/2023 1009 by Donne Hazel, LPN Outcome: Progressing Goal: Individualized Newborn Educational Video(s) 04/07/2023 1010 by Donne Hazel, LPN Outcome: Adequate for Discharge 04/07/2023 1009 by Donne Hazel, LPN Outcome: Progressing

## 2023-04-17 ENCOUNTER — Telehealth (HOSPITAL_COMMUNITY): Payer: Self-pay | Admitting: *Deleted

## 2023-04-17 NOTE — Telephone Encounter (Signed)
 04/17/2023  Name: Laura Glass MRN: [id number] DOB: 02/09/1985  Reason for Call:  Transition of Care Hospital Discharge Call  Contact Status: Patient Contact Status: Message  Language assistant needed:          Follow-Up Questions:    Inocente Salles Postnatal Depression Scale:  In the Past 7 Days:    PHQ2-9 Depression Scale:     Discharge Follow-up:    Post-discharge interventions: NA  Salena Saner, RN 04/17/2023 12:49

## 2023-12-25 IMAGING — US US ABDOMEN COMPLETE
1 series · 15 of 25 positions shown · non-contrast
Comparison: None.

CLINICAL DATA: Abdominal pain with nausea and vomiting.

EXAM:
ABDOMEN ULTRASOUND COMPLETE

[Series 1: us abdomen complete mc & wl · 15 of 96 slices shown]
[im 1/96]
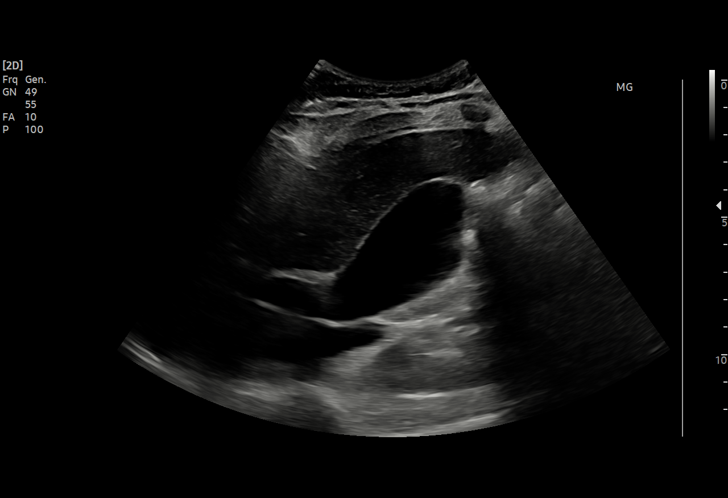
[im 8/96]
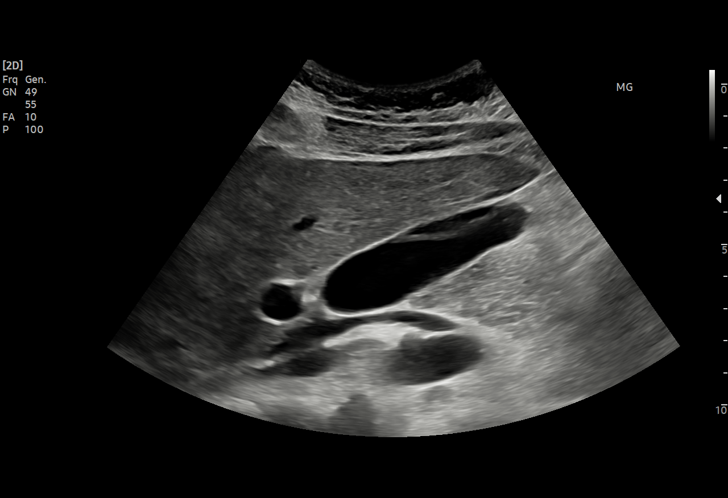
[im 16/96]
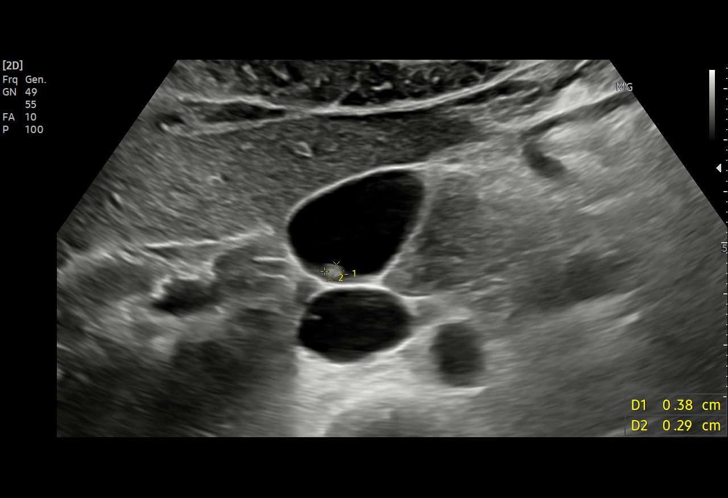
[im 20/96]
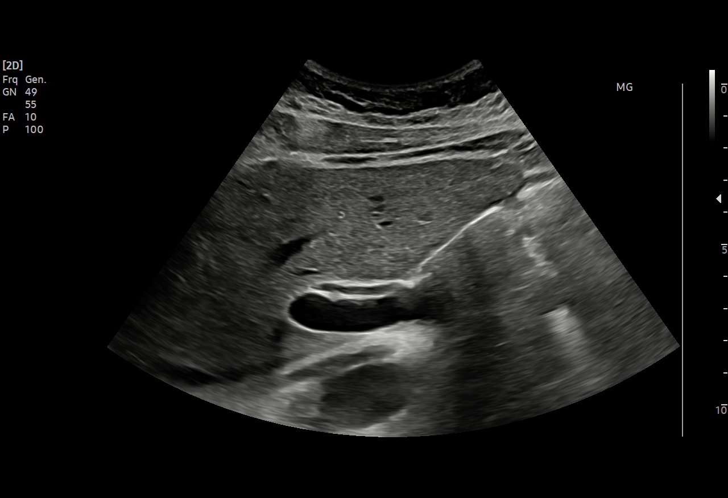
[im 28/96]
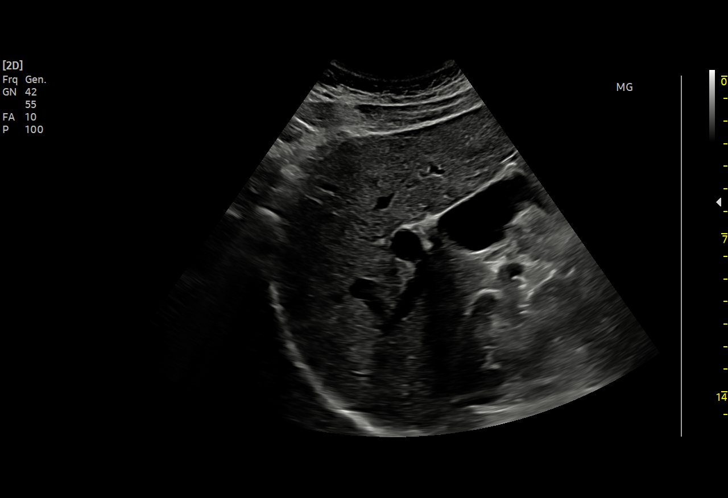
[im 36/96]
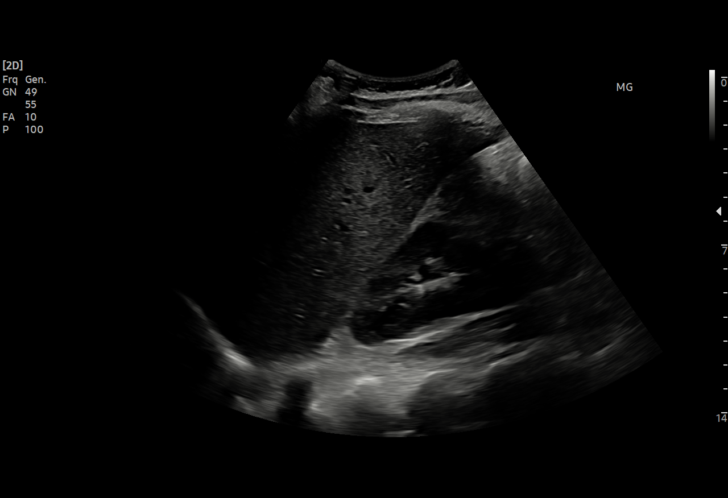
[im 40/96]
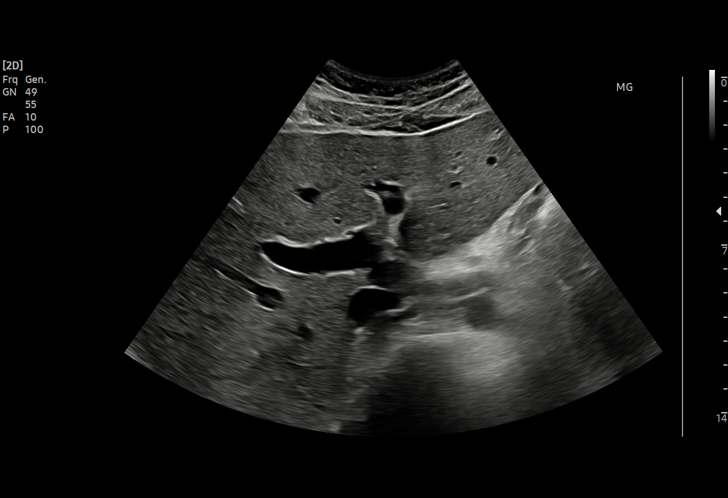
[im 48/96]
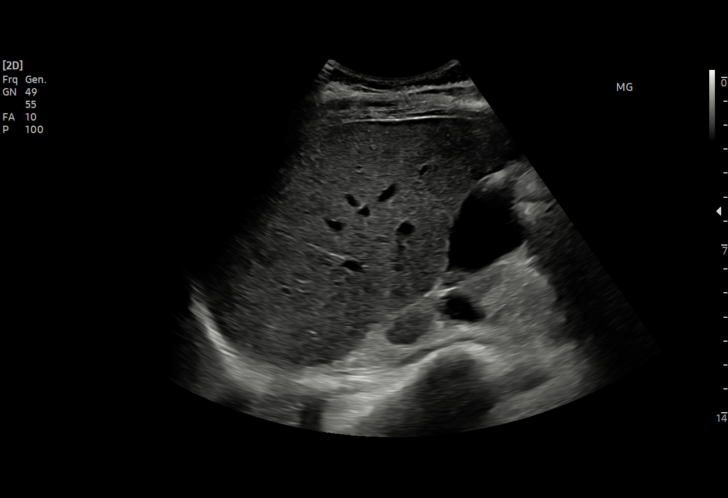
[im 56/96]
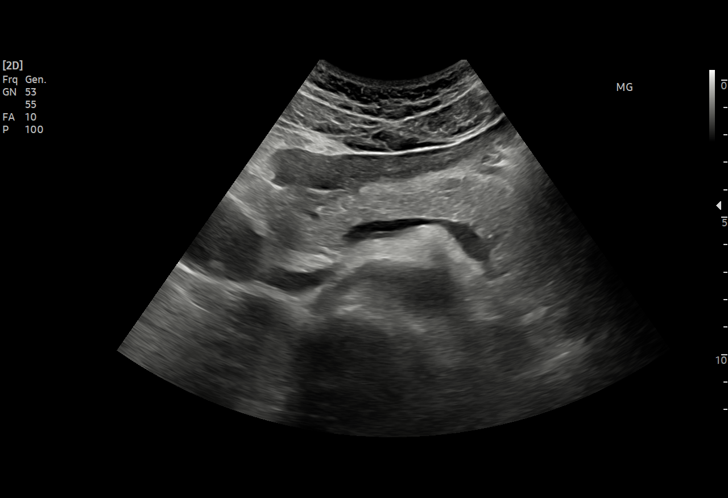
[im 60/96]
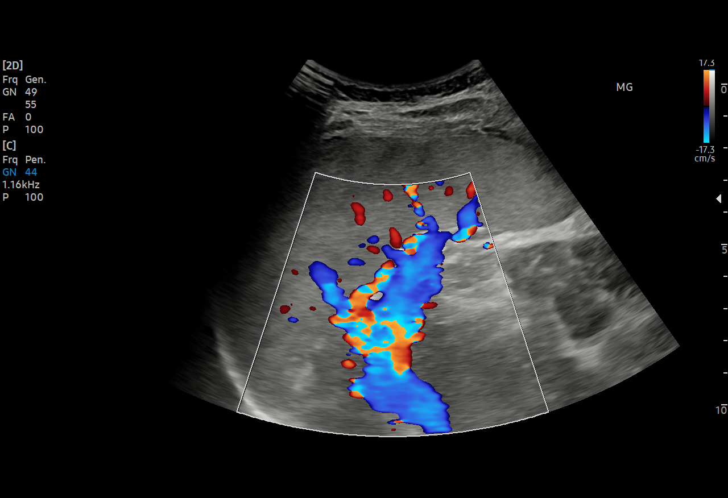
[im 68/96]
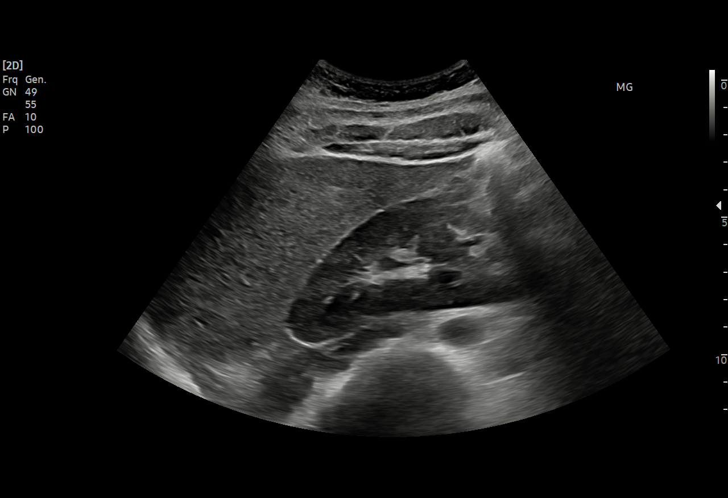
[im 76/96]
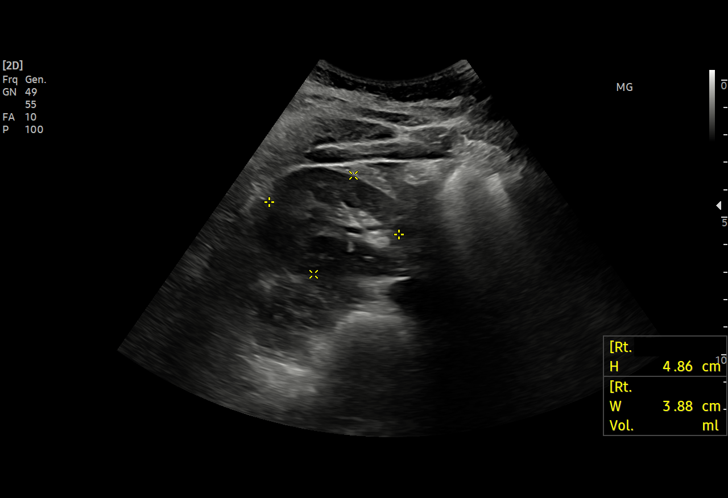
[im 80/96]
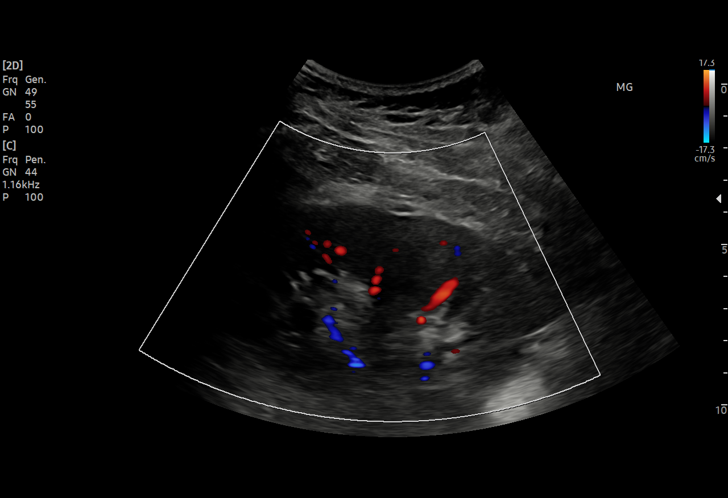
[im 88/96]
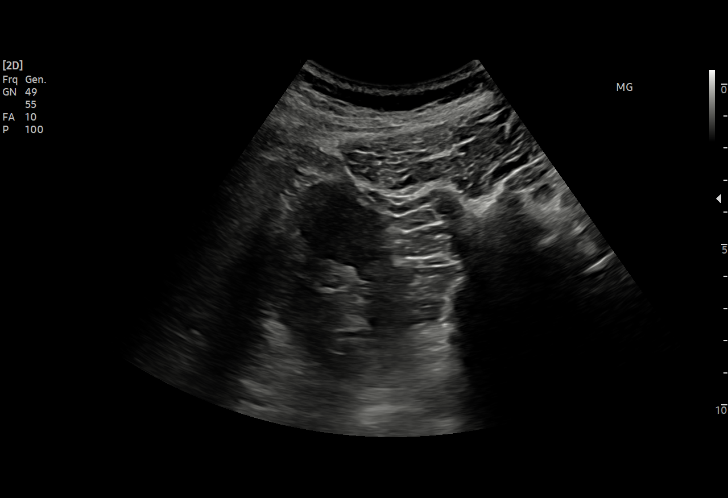
[im 96/96]
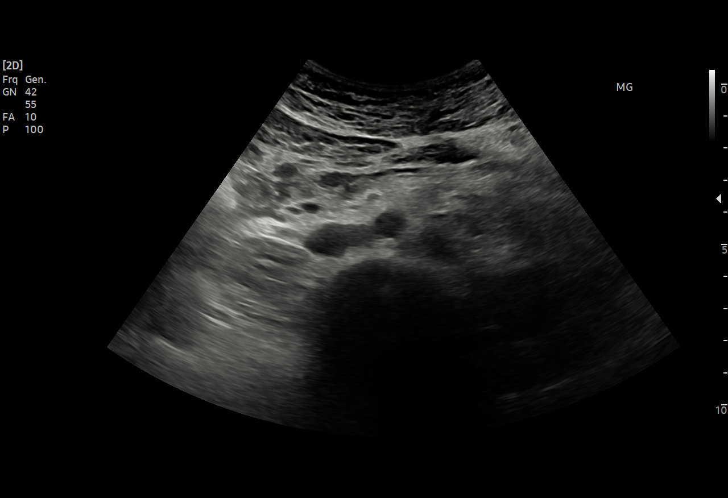

[15 of 25 positions shown; findings below may reference images not displayed]

FINDINGS: Gallbladder: There is a 4 mm polyp in the gallbladder. No wall
thickening, stones, sludge, or pericholecystic fluid. No Murphy's
sign.

Common bile duct: Diameter: 2.5 mm

Liver: No focal lesion identified. Within normal limits in
parenchymal echogenicity. Portal vein is patent on color Doppler
imaging with normal direction of blood flow towards the liver.

IVC: No abnormality visualized.

Pancreas: Visualized portion unremarkable.

Spleen: Size and appearance within normal limits.

Right Kidney: Length: 11.8 cm. Echogenicity within normal limits. No
mass or hydronephrosis visualized.

Left Kidney: Length: 10.6 cm. Echogenicity within normal limits. No
mass or hydronephrosis visualized.

Abdominal aorta: No aneurysm visualized.

Other findings: None.
IMPRESSION: 1. There is a 4 mm polyp in the gallbladder. Polyps less than 6 mm
in size do not require imaging follow-up.
2. No other abnormalities. No cause for the patient's symptoms
identified.

## 2024-09-10 ENCOUNTER — Encounter: Admitting: Family
